# Patient Record
Sex: Male | Born: 1970 | Race: Black or African American | Hispanic: No | Marital: Single | Smoking: Current some day smoker
Health system: Southern US, Community
[De-identification: ages and names within clinical notes are randomized; demographics above are authoritative.]

## PROBLEM LIST (undated history)

## (undated) DIAGNOSIS — W3400XA Accidental discharge from unspecified firearms or gun, initial encounter: Secondary | ICD-10-CM

## (undated) DIAGNOSIS — F419 Anxiety disorder, unspecified: Secondary | ICD-10-CM

## (undated) DIAGNOSIS — I10 Essential (primary) hypertension: Secondary | ICD-10-CM

## (undated) HISTORY — PX: HERNIA REPAIR: SHX51

---

## 1993-02-26 DIAGNOSIS — Y249XXA Unspecified firearm discharge, undetermined intent, initial encounter: Secondary | ICD-10-CM

## 1993-02-26 DIAGNOSIS — W3400XA Accidental discharge from unspecified firearms or gun, initial encounter: Secondary | ICD-10-CM

## 1993-02-26 HISTORY — DX: Unspecified firearm discharge, undetermined intent, initial encounter: Y24.9XXA

## 1993-02-26 HISTORY — DX: Accidental discharge from unspecified firearms or gun, initial encounter: W34.00XA

## 1998-07-15 ENCOUNTER — Emergency Department (HOSPITAL_COMMUNITY): Admission: EM | Admit: 1998-07-15 | Discharge: 1998-07-15 | Payer: Self-pay | Admitting: Emergency Medicine

## 1999-04-12 ENCOUNTER — Emergency Department (HOSPITAL_COMMUNITY): Admission: EM | Admit: 1999-04-12 | Discharge: 1999-04-12 | Payer: Self-pay | Admitting: Emergency Medicine

## 2000-03-09 ENCOUNTER — Emergency Department (HOSPITAL_COMMUNITY): Admission: EM | Admit: 2000-03-09 | Discharge: 2000-03-09 | Payer: Self-pay | Admitting: Emergency Medicine

## 2000-05-12 ENCOUNTER — Emergency Department (HOSPITAL_COMMUNITY): Admission: EM | Admit: 2000-05-12 | Discharge: 2000-05-12 | Payer: Self-pay | Admitting: *Deleted

## 2014-09-27 ENCOUNTER — Emergency Department (HOSPITAL_COMMUNITY)
Admission: EM | Admit: 2014-09-27 | Discharge: 2014-09-27 | Disposition: A | Discharge status: Home / Self Care | Payer: Self-pay | Attending: Emergency Medicine | Admitting: Emergency Medicine

## 2014-09-27 ENCOUNTER — Encounter (HOSPITAL_COMMUNITY): Payer: Self-pay

## 2014-09-27 DIAGNOSIS — Z87828 Personal history of other (healed) physical injury and trauma: Secondary | ICD-10-CM | POA: Insufficient documentation

## 2014-09-27 DIAGNOSIS — K4090 Unilateral inguinal hernia, without obstruction or gangrene, not specified as recurrent: Secondary | ICD-10-CM | POA: Insufficient documentation

## 2014-09-27 DIAGNOSIS — M79605 Pain in left leg: Secondary | ICD-10-CM | POA: Insufficient documentation

## 2014-09-27 DIAGNOSIS — Z720 Tobacco use: Secondary | ICD-10-CM | POA: Insufficient documentation

## 2014-09-27 HISTORY — DX: Accidental discharge from unspecified firearms or gun, initial encounter: W34.00XA

## 2014-09-27 NOTE — Discharge Instructions (Signed)
Please follow-up with surgeon as discussed. Please monitor for new or worsening signs or symptoms, be experience any please return for further evaluation.
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
## 2016-05-10 ENCOUNTER — Encounter (HOSPITAL_COMMUNITY): Payer: Self-pay | Admitting: Emergency Medicine

## 2016-05-10 ENCOUNTER — Emergency Department (HOSPITAL_COMMUNITY)
Admission: EM | Admit: 2016-05-10 | Discharge: 2016-05-10 | Disposition: A | Discharge status: Home / Self Care | Payer: Self-pay | Attending: Emergency Medicine | Admitting: Emergency Medicine

## 2016-05-10 ENCOUNTER — Emergency Department (HOSPITAL_COMMUNITY): Payer: Self-pay

## 2016-05-10 DIAGNOSIS — F17210 Nicotine dependence, cigarettes, uncomplicated: Secondary | ICD-10-CM | POA: Insufficient documentation

## 2016-05-10 DIAGNOSIS — K4090 Unilateral inguinal hernia, without obstruction or gangrene, not specified as recurrent: Secondary | ICD-10-CM | POA: Insufficient documentation

## 2016-05-10 LAB — CBC
HEMATOCRIT: 40.2 % (ref 39.0–52.0)
HEMOGLOBIN: 13.4 g/dL (ref 13.0–17.0)
MCH: 27.4 pg (ref 26.0–34.0)
MCHC: 33.3 g/dL (ref 30.0–36.0)
MCV: 82.2 fL (ref 78.0–100.0)
Platelets: 287 10*3/uL (ref 150–400)
RBC: 4.89 MIL/uL (ref 4.22–5.81)
RDW: 14.5 % (ref 11.5–15.5)
WBC: 16.1 10*3/uL — ABNORMAL HIGH (ref 4.0–10.5)

## 2016-05-10 LAB — BASIC METABOLIC PANEL
Anion gap: 10 (ref 5–15)
BUN: 15 mg/dL (ref 6–20)
CHLORIDE: 102 mmol/L (ref 101–111)
CO2: 25 mmol/L (ref 22–32)
CREATININE: 1.25 mg/dL — AB (ref 0.61–1.24)
Calcium: 9.4 mg/dL (ref 8.9–10.3)
GFR calc Af Amer: >60 mL/min (ref >60)
GFR calc non Af Amer: >60 mL/min (ref >60)
Glucose, Bld: 129 mg/dL — ABNORMAL HIGH (ref 65–99)
POTASSIUM: 3.4 mmol/L — AB (ref 3.5–5.1)
Sodium: 137 mmol/L (ref 135–145)

## 2016-05-10 MED ORDER — IOPAMIDOL (ISOVUE-300) INJECTION 61%
INTRAVENOUS | Status: AC
  Administered 2016-05-10: 100 mL
  Filled 2016-05-10: qty 100

## 2016-05-10 MED ORDER — HYDROMORPHONE HCL 1 MG/ML IJ SOLN
1.0000 mg | Freq: Once | INTRAMUSCULAR | Status: AC
  Administered 2016-05-10: 1 mg via INTRAVENOUS
  Filled 2016-05-10: qty 1

## 2016-05-10 MED ORDER — OXYCODONE-ACETAMINOPHEN 5-325 MG PO TABS: 1.0000 | ORAL_TABLET | ORAL | 0 refills | Status: DC | PRN

## 2016-05-10 NOTE — ED Triage Notes (Signed)
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 States no PCP/GI MD involvement. Also reports abdominal hernia. States "vomiking phlegm."

## 2016-05-10 NOTE — ED Notes (Signed)
Pt ambulatory to restroom, steady gait.

## 2016-05-10 NOTE — ED Provider Notes (Signed)
Medical screening examination/treatment/procedure(s) were conducted as a shared visit with non-physician practitioner(s) and myself.  I personally evaluated the patient during the encounter. Briefly, the patient is a 46 y.o. male history of recurrent right inguinal hernia presents to the ED with recurrence of his inguinal hernia that is painful. CT scan without evidence of obstruction. Hernia easily reducible after pain medicine.   Hernia reduction Date/Time: 05/10/2016 6:09 AM Performed by: Nira ConnARDAMA, Darrelyn Morro EDUARDO Authorized by: Nira ConnARDAMA, Brissia Delisa EDUARDO  Consent: Verbal consent obtained. Consent given by: patient Patient understanding: patient states understanding of the procedure being performed Patient identity confirmed: verbally with patient Local anesthesia used: no  Anesthesia: Local anesthesia used: no  Sedation: Patient sedated: no Patient tolerance: Patient tolerated the procedure well with no immediate complications    Safe for discharge with strict return precautions.   Nira ConnPedro Eduardo Khiree Bukhari, MD 05/10/16 [phone]
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
## 2017-10-26 ENCOUNTER — Inpatient Hospital Stay (HOSPITAL_COMMUNITY)
Admission: EM | Admit: 2017-10-26 | Discharge: 2017-10-28 | DRG: 352 | Disposition: A | Discharge status: Home / Self Care | Payer: Self-pay | Attending: General Surgery | Admitting: General Surgery

## 2017-10-26 ENCOUNTER — Other Ambulatory Visit: Payer: Self-pay

## 2017-10-26 ENCOUNTER — Emergency Department (HOSPITAL_COMMUNITY): Payer: Self-pay

## 2017-10-26 ENCOUNTER — Encounter (HOSPITAL_COMMUNITY): Payer: Self-pay

## 2017-10-26 DIAGNOSIS — K4090 Unilateral inguinal hernia, without obstruction or gangrene, not specified as recurrent: Secondary | ICD-10-CM

## 2017-10-26 DIAGNOSIS — K4031 Unilateral inguinal hernia, with obstruction, without gangrene, recurrent: Secondary | ICD-10-CM

## 2017-10-26 DIAGNOSIS — K56609 Unspecified intestinal obstruction, unspecified as to partial versus complete obstruction: Secondary | ICD-10-CM

## 2017-10-26 DIAGNOSIS — K4030 Unilateral inguinal hernia, with obstruction, without gangrene, not specified as recurrent: Principal | ICD-10-CM | POA: Diagnosis present

## 2017-10-26 LAB — CBC WITH DIFFERENTIAL/PLATELET
Basophils Absolute: 0 10*3/uL (ref 0.0–0.1)
Basophils Relative: 0 %
Eosinophils Absolute: 0.1 10*3/uL (ref 0.0–0.7)
Eosinophils Relative: 0 %
HCT: 41.8 % (ref 39.0–52.0)
HEMOGLOBIN: 14 g/dL (ref 13.0–17.0)
LYMPHS ABS: 1.7 10*3/uL (ref 0.7–4.0)
LYMPHS PCT: 11 %
MCH: 27.7 pg (ref 26.0–34.0)
MCHC: 33.5 g/dL (ref 30.0–36.0)
MCV: 82.6 fL (ref 78.0–100.0)
Monocytes Absolute: 0.7 10*3/uL (ref 0.1–1.0)
Monocytes Relative: 5 %
NEUTROS ABS: 12.7 10*3/uL — AB (ref 1.7–7.7)
NEUTROS PCT: 84 %
Platelets: 335 10*3/uL (ref 150–400)
RBC: 5.06 MIL/uL (ref 4.22–5.81)
RDW: 14.1 % (ref 11.5–15.5)
WBC: 15.1 10*3/uL — ABNORMAL HIGH (ref 4.0–10.5)

## 2017-10-26 LAB — COMPREHENSIVE METABOLIC PANEL
ALBUMIN: 4.6 g/dL (ref 3.5–5.0)
ALT: 20 U/L (ref 0–44)
AST: 37 U/L (ref 15–41)
Alkaline Phosphatase: 61 U/L (ref 38–126)
Anion gap: 13 (ref 5–15)
BUN: 23 mg/dL — AB (ref 6–20)
CALCIUM: 9.9 mg/dL (ref 8.9–10.3)
CHLORIDE: 101 mmol/L (ref 98–111)
CO2: 28 mmol/L (ref 22–32)
CREATININE: 1.28 mg/dL — AB (ref 0.61–1.24)
GFR calc Af Amer: >60 mL/min (ref >60)
GFR calc non Af Amer: >60 mL/min (ref >60)
GLUCOSE: 100 mg/dL — AB (ref 70–99)
Potassium: 4.1 mmol/L (ref 3.5–5.1)
SODIUM: 142 mmol/L (ref 135–145)
Total Bilirubin: 1.1 mg/dL (ref 0.3–1.2)
Total Protein: 8.3 g/dL — ABNORMAL HIGH (ref 6.5–8.1)

## 2017-10-26 LAB — I-STAT CG4 LACTIC ACID, ED: Lactic Acid, Venous: 1.28 mmol/L (ref 0.5–1.9)

## 2017-10-26 LAB — ETHANOL: Alcohol, Ethyl (B): <10 mg/dL (ref <10)

## 2017-10-26 MED ORDER — PROPOFOL 10 MG/ML IV BOLUS
1.0000 mg/kg | Freq: Once | INTRAVENOUS | Status: AC
  Administered 2017-10-26: 80 mg via INTRAVENOUS
  Filled 2017-10-26: qty 20

## 2017-10-26 MED ORDER — SODIUM CHLORIDE 0.9 % IV BOLUS
1000.0000 mL | Freq: Once | INTRAVENOUS | Status: AC
  Administered 2017-10-26: 1000 mL via INTRAVENOUS

## 2017-10-26 MED ORDER — ONDANSETRON HCL 4 MG/2ML IJ SOLN
4.0000 mg | Freq: Once | INTRAMUSCULAR | Status: AC
  Administered 2017-10-26: 4 mg via INTRAVENOUS
  Filled 2017-10-26: qty 2

## 2017-10-26 MED ORDER — IOPAMIDOL (ISOVUE-300) INJECTION 61%
100.0000 mL | Freq: Once | INTRAVENOUS | Status: AC | PRN
  Administered 2017-10-26: 100 mL via INTRAVENOUS

## 2017-10-26 MED ORDER — IOPAMIDOL (ISOVUE-300) INJECTION 61%
INTRAVENOUS | Status: AC
  Filled 2017-10-26: qty 100

## 2017-10-26 MED ORDER — MORPHINE SULFATE (PF) 4 MG/ML IV SOLN
4.0000 mg | Freq: Once | INTRAVENOUS | Status: AC
  Administered 2017-10-26: 4 mg via INTRAVENOUS
  Filled 2017-10-26: qty 1

## 2017-10-26 NOTE — ED Triage Notes (Signed)
Pt comes from home. Pt is AOx4 and ambulatory. Pt called 911 due to possible hernia pain that patient has not been diagnosed with.Possible Inguinal Hernia on right side. Pt stated he has been living with it for a year however pt stated it began to hurt more today than usual. Pt has not been very forth coming with EMS regarding information about pain, location, basically anything regarding the chief complaint pt has provided pieces of info. Pt did have episode of N/V.  Pt has no other complaints.  Pt would not let EMS check area where hernia is.  Medications given in route by EMS- 4mg  Zofran  100mcg Fentanyl

## 2017-10-26 NOTE — ED Notes (Signed)
Bed: [id number] Expected date:  Expected time:  Means of arrival:  Comments: Hernia pain
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
## 2017-10-27 ENCOUNTER — Encounter (HOSPITAL_COMMUNITY): Payer: Self-pay | Admitting: *Deleted

## 2017-10-27 ENCOUNTER — Inpatient Hospital Stay (HOSPITAL_COMMUNITY): Payer: Self-pay

## 2017-10-27 ENCOUNTER — Encounter (HOSPITAL_COMMUNITY): Admission: EM | Disposition: A | Discharge status: Home / Self Care | Payer: Self-pay | Source: Home / Self Care

## 2017-10-27 ENCOUNTER — Inpatient Hospital Stay (HOSPITAL_COMMUNITY): Payer: Self-pay | Admitting: Certified Registered Nurse Anesthetist

## 2017-10-27 DIAGNOSIS — K4090 Unilateral inguinal hernia, without obstruction or gangrene, not specified as recurrent: Secondary | ICD-10-CM

## 2017-10-27 HISTORY — PX: INGUINAL HERNIA REPAIR: SHX194

## 2017-10-27 LAB — SURGICAL PCR SCREEN
MRSA, PCR: NEGATIVE
Staphylococcus aureus: NEGATIVE

## 2017-10-27 LAB — BASIC METABOLIC PANEL
Anion gap: 10 (ref 5–15)
BUN: 18 mg/dL (ref 6–20)
CO2: 28 mmol/L (ref 22–32)
Calcium: 9.1 mg/dL (ref 8.9–10.3)
Chloride: 103 mmol/L (ref 98–111)
Creatinine, Ser: 1.19 mg/dL (ref 0.61–1.24)
GFR calc Af Amer: >60 mL/min (ref >60)
Glucose, Bld: 88 mg/dL (ref 70–99)
POTASSIUM: 3.7 mmol/L (ref 3.5–5.1)
SODIUM: 141 mmol/L (ref 135–145)

## 2017-10-27 LAB — CBC
HEMATOCRIT: 41.1 % (ref 39.0–52.0)
Hemoglobin: 13.5 g/dL (ref 13.0–17.0)
MCH: 27.2 pg (ref 26.0–34.0)
MCHC: 32.8 g/dL (ref 30.0–36.0)
MCV: 82.9 fL (ref 78.0–100.0)
Platelets: 335 10*3/uL (ref 150–400)
RBC: 4.96 MIL/uL (ref 4.22–5.81)
RDW: 14.2 % (ref 11.5–15.5)
WBC: 11.6 10*3/uL — ABNORMAL HIGH (ref 4.0–10.5)

## 2017-10-27 LAB — HIV ANTIBODY (ROUTINE TESTING W REFLEX): HIV SCREEN 4TH GENERATION: NONREACTIVE

## 2017-10-27 SURGERY — REPAIR, HERNIA, INGUINAL, ADULT
Anesthesia: General | Site: Groin | Laterality: Right

## 2017-10-27 MED ORDER — HYDRALAZINE HCL 20 MG/ML IJ SOLN: 10.0000 mg | INTRAMUSCULAR | Status: DC | PRN

## 2017-10-27 MED ORDER — ONDANSETRON 4 MG PO TBDP: 4.0000 mg | ORAL_TABLET | Freq: Four times a day (QID) | ORAL | Status: DC | PRN

## 2017-10-27 MED ORDER — MORPHINE SULFATE (PF) 2 MG/ML IV SOLN: 1.0000 mg | INTRAVENOUS | Status: DC | PRN

## 2017-10-27 MED ORDER — LIDOCAINE 2% (20 MG/ML) 5 ML SYRINGE
INTRAMUSCULAR | Status: DC | PRN
  Administered 2017-10-27: 100 mg via INTRAVENOUS

## 2017-10-27 MED ORDER — TRAMADOL HCL 50 MG PO TABS: 50.0000 mg | ORAL_TABLET | Freq: Four times a day (QID) | ORAL | Status: DC | PRN

## 2017-10-27 MED ORDER — HYDROCODONE-ACETAMINOPHEN 5-325 MG PO TABS
1.0000 | ORAL_TABLET | ORAL | Status: DC | PRN
  Administered 2017-10-27 (×2): 1 via ORAL
  Filled 2017-10-27 (×2): qty 1

## 2017-10-27 MED ORDER — DEXAMETHASONE SODIUM PHOSPHATE 4 MG/ML IJ SOLN
INTRAMUSCULAR | Status: DC | PRN
  Administered 2017-10-27: 10 mg via INTRAVENOUS

## 2017-10-27 MED ORDER — 0.9 % SODIUM CHLORIDE (POUR BTL) OPTIME
TOPICAL | Status: DC | PRN
  Administered 2017-10-27: 1000 mL

## 2017-10-27 MED ORDER — PROPOFOL 10 MG/ML IV BOLUS
INTRAVENOUS | Status: DC | PRN
  Administered 2017-10-27: 200 mg via INTRAVENOUS

## 2017-10-27 MED ORDER — LACTATED RINGERS IV SOLN
INTRAVENOUS | Status: DC | PRN
  Administered 2017-10-27 (×2): via INTRAVENOUS

## 2017-10-27 MED ORDER — ONDANSETRON HCL 4 MG/2ML IJ SOLN
4.0000 mg | Freq: Four times a day (QID) | INTRAMUSCULAR | Status: DC | PRN
  Administered 2017-10-27: 4 mg via INTRAVENOUS

## 2017-10-27 MED ORDER — CEFAZOLIN SODIUM-DEXTROSE 2-4 GM/100ML-% IV SOLN
2.0000 g | INTRAVENOUS | Status: AC
  Administered 2017-10-27: 2 g via INTRAVENOUS

## 2017-10-27 MED ORDER — IBUPROFEN 200 MG PO TABS: 600.0000 mg | ORAL_TABLET | Freq: Four times a day (QID) | ORAL | Status: DC | PRN

## 2017-10-27 MED ORDER — KCL IN DEXTROSE-NACL 20-5-0.45 MEQ/L-%-% IV SOLN
INTRAVENOUS | Status: DC
  Administered 2017-10-27: 02:00:00 via INTRAVENOUS
  Filled 2017-10-27 (×2): qty 1000

## 2017-10-27 MED ORDER — HYDROMORPHONE HCL 1 MG/ML IJ SOLN: 0.2500 mg | INTRAMUSCULAR | Status: DC | PRN

## 2017-10-27 MED ORDER — PROPOFOL 10 MG/ML IV BOLUS
INTRAVENOUS | Status: AC
  Filled 2017-10-27: qty 20

## 2017-10-27 MED ORDER — PHENYLEPHRINE 40 MCG/ML (10ML) SYRINGE FOR IV PUSH (FOR BLOOD PRESSURE SUPPORT)
PREFILLED_SYRINGE | INTRAVENOUS | Status: DC | PRN
  Administered 2017-10-27 (×5): 80 ug via INTRAVENOUS

## 2017-10-27 MED ORDER — FENTANYL CITRATE (PF) 100 MCG/2ML IJ SOLN
INTRAMUSCULAR | Status: DC | PRN
  Administered 2017-10-27: 100 ug via INTRAVENOUS
  Administered 2017-10-27 (×3): 50 ug via INTRAVENOUS

## 2017-10-27 MED ORDER — DIPHENHYDRAMINE HCL 50 MG/ML IJ SOLN: 25.0000 mg | Freq: Four times a day (QID) | INTRAMUSCULAR | Status: DC | PRN

## 2017-10-27 MED ORDER — FENTANYL CITRATE (PF) 250 MCG/5ML IJ SOLN
INTRAMUSCULAR | Status: AC
  Filled 2017-10-27: qty 5

## 2017-10-27 MED ORDER — DIPHENHYDRAMINE HCL 25 MG PO CAPS: 25.0000 mg | ORAL_CAPSULE | Freq: Four times a day (QID) | ORAL | Status: DC | PRN

## 2017-10-27 MED ORDER — ROCURONIUM BROMIDE 10 MG/ML (PF) SYRINGE
PREFILLED_SYRINGE | INTRAVENOUS | Status: DC | PRN
  Administered 2017-10-27: 20 mg via INTRAVENOUS
  Administered 2017-10-27: 70 mg via INTRAVENOUS

## 2017-10-27 MED ORDER — ENOXAPARIN SODIUM 40 MG/0.4ML ~~LOC~~ SOLN
40.0000 mg | Freq: Every day | SUBCUTANEOUS | Status: DC
  Administered 2017-10-27: 40 mg via SUBCUTANEOUS
  Filled 2017-10-27: qty 0.4

## 2017-10-27 MED ORDER — ONDANSETRON HCL 4 MG/2ML IJ SOLN: 4.0000 mg | Freq: Four times a day (QID) | INTRAMUSCULAR | Status: DC | PRN

## 2017-10-27 MED ORDER — PROMETHAZINE HCL 25 MG/ML IJ SOLN: 6.2500 mg | INTRAMUSCULAR | Status: DC | PRN

## 2017-10-27 MED ORDER — MIDAZOLAM HCL 2 MG/2ML IJ SOLN
INTRAMUSCULAR | Status: AC
  Filled 2017-10-27: qty 2

## 2017-10-27 MED ORDER — CEFAZOLIN SODIUM-DEXTROSE 2-4 GM/100ML-% IV SOLN
2.0000 g | Freq: Three times a day (TID) | INTRAVENOUS | Status: AC
  Administered 2017-10-27: 2 g via INTRAVENOUS
  Filled 2017-10-27: qty 100

## 2017-10-27 MED ORDER — MEPERIDINE HCL 50 MG/ML IJ SOLN: 6.2500 mg | INTRAMUSCULAR | Status: DC | PRN

## 2017-10-27 MED ORDER — KCL IN DEXTROSE-NACL 20-5-0.45 MEQ/L-%-% IV SOLN
INTRAVENOUS | Status: DC
  Administered 2017-10-27: 14:00:00 via INTRAVENOUS
  Filled 2017-10-27 (×2): qty 1000

## 2017-10-27 MED ORDER — MIDAZOLAM HCL 5 MG/5ML IJ SOLN
INTRAMUSCULAR | Status: DC | PRN
  Administered 2017-10-27: 2 mg via INTRAVENOUS

## 2017-10-27 MED ORDER — SUGAMMADEX SODIUM 200 MG/2ML IV SOLN
INTRAVENOUS | Status: DC | PRN
  Administered 2017-10-27: 200 mg via INTRAVENOUS

## 2017-10-27 MED ORDER — CEFAZOLIN SODIUM-DEXTROSE 2-4 GM/100ML-% IV SOLN
INTRAVENOUS | Status: AC
  Filled 2017-10-27: qty 100

## 2017-10-27 MED ORDER — HYDROMORPHONE HCL 1 MG/ML IJ SOLN: 0.5000 mg | INTRAMUSCULAR | Status: DC | PRN

## 2017-10-27 MED ORDER — BUPIVACAINE LIPOSOME 1.3 % IJ SUSP
20.0000 mL | Freq: Once | INTRAMUSCULAR | Status: AC
  Administered 2017-10-27: 20 mL
  Filled 2017-10-27: qty 20

## 2017-10-27 SURGICAL SUPPLY — 26 items
ADH SKN CLS APL DERMABOND .7 (GAUZE/BANDAGES/DRESSINGS) ×1
CHLORAPREP W/TINT 26ML (MISCELLANEOUS) ×3 IMPLANT
DECANTER SPIKE VIAL GLASS SM (MISCELLANEOUS) ×2 IMPLANT
DERMABOND ADVANCED (GAUZE/BANDAGES/DRESSINGS) ×2
DERMABOND ADVANCED .7 DNX12 (GAUZE/BANDAGES/DRESSINGS) ×1 IMPLANT
DRAIN PENROSE 18X1/2 LTX STRL (DRAIN) ×3 IMPLANT
DRAPE LAPAROTOMY TRNSV 102X78 (DRAPE) ×3 IMPLANT
DRAPE UTILITY XL STRL (DRAPES) ×3 IMPLANT
ELECT REM PT RETURN 15FT ADLT (MISCELLANEOUS) ×3 IMPLANT
GLOVE BIO SURGEON STRL SZ 6.5 (GLOVE) ×2 IMPLANT
GLOVE BIO SURGEONS STRL SZ 6.5 (GLOVE) ×1
GLOVE BIOGEL PI IND STRL 7.0 (GLOVE) ×1 IMPLANT
GLOVE BIOGEL PI INDICATOR 7.0 (GLOVE) ×2
GOWN STRL REUS W/TWL 2XL LVL3 (GOWN DISPOSABLE) ×3 IMPLANT
GOWN STRL REUS W/TWL XL LVL3 (GOWN DISPOSABLE) ×3 IMPLANT
KIT BASIN OR (CUSTOM PROCEDURE TRAY) ×3 IMPLANT
MESH HERNIA 3X6 (Mesh General) ×2 IMPLANT
NEEDLE HYPO 22GX1.5 SAFETY (NEEDLE) ×3 IMPLANT
PACK GENERAL/GYN (CUSTOM PROCEDURE TRAY) ×3 IMPLANT
SUT PROLENE 2 0 CT2 30 (SUTURE) ×2 IMPLANT
SUT VIC AB 2-0 SH 18 (SUTURE) ×3 IMPLANT
SUT VIC AB 3-0 SH 18 (SUTURE) ×3 IMPLANT
SUT VIC AB 4-0 PS2 27 (SUTURE) ×3 IMPLANT
SYR CONTROL 10ML LL (SYRINGE) ×3 IMPLANT
TOWEL OR 17X26 10 PK STRL BLUE (TOWEL DISPOSABLE) ×3 IMPLANT
TOWEL OR NON WOVEN STRL DISP B (DISPOSABLE) ×3 IMPLANT
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
## 2017-10-27 NOTE — Transfer of Care (Signed)
Immediate Anesthesia Transfer of Care Note  Patient: Alan Mullen  Procedure(s) Performed: HERNIA REPAIR INGUINAL ADULT WITH MESH (Right Groin)  Patient Location: PACU  Anesthesia Type:General  Level of Consciousness: drowsy  Airway & Oxygen Therapy: Patient Spontanous Breathing and Patient connected to face mask  Post-op Assessment: Report given to RN and Post -op Vital signs reviewed and stable  Post vital signs: Reviewed and stable  Last Vitals:  Vitals Value Taken Time  BP    Temp    Pulse 45 10/27/2017 12:29 PM  Resp    SpO2 99 % 10/27/2017 12:29 PM  Vitals shown include unvalidated device data.  Last Pain:  Vitals:   10/27/17 0457  TempSrc: Oral  PainSc:       Patients Stated Pain Goal: 2 (10/27/17 0037)  Complications: No apparent anesthesia complications

## 2017-10-27 NOTE — Anesthesia Preprocedure Evaluation (Addendum)
Anesthesia Evaluation  Patient identified by MRN, date of birth, ID band Patient awake    Reviewed: Allergy & Precautions, NPO status , Patient's Chart, lab work & pertinent test results  Airway Mallampati: II  TM Distance: >3 FB Neck ROM: Full    Dental no notable dental hx.    Pulmonary neg pulmonary ROS, Current Smoker,    Pulmonary exam normal breath sounds clear to auscultation       Cardiovascular negative cardio ROS Normal cardiovascular exam Rhythm:Regular Rate:Normal     Neuro/Psych negative neurological ROS  negative psych ROS   GI/Hepatic negative GI ROS, (+)     substance abuse  marijuana use,   Endo/Other  negative endocrine ROS  Renal/GU negative Renal ROS     Musculoskeletal negative musculoskeletal ROS (+)   Abdominal   Peds  Hematology negative hematology ROS (+)   Anesthesia Other Findings Day of surgery medications reviewed with the patient.  Reproductive/Obstetrics                            Anesthesia Physical Anesthesia Plan  ASA: II  Anesthesia Plan: General   Post-op Pain Management:    Induction: Intravenous  PONV Risk Score and Plan: 3 and Dexamethasone and Ondansetron  Airway Management Planned: Oral ETT  Additional Equipment: None  Intra-op Plan:   Post-operative Plan: Extubation in OR  Informed Consent: I have reviewed the patients History and Physical, chart, labs and discussed the procedure including the risks, benefits and alternatives for the proposed anesthesia with the patient or authorized representative who has indicated his/her understanding and acceptance.   Dental advisory given  Plan Discussed with: CRNA  Anesthesia Plan Comments:         Anesthesia Quick Evaluation

## 2017-10-27 NOTE — Progress Notes (Signed)
   10/26/17 2314  MEWS Score  MEWS Score Color Yellow  Patient is showing no signs of decreased consciousness or a decline in condition.

## 2017-10-27 NOTE — Anesthesia Procedure Notes (Signed)
Procedure Name: Intubation Date/Time: 10/27/2017 10:42 AM Performed by: Vanessa [place], CRNA Pre-anesthesia Checklist: Patient identified, Emergency Drugs available, Suction available and Patient being monitored Patient Re-evaluated:Patient Re-evaluated prior to induction Oxygen Delivery Method: Circle system utilized Preoxygenation: Pre-oxygenation with 100% oxygen Induction Type: IV induction Ventilation: Mask ventilation without difficulty Laryngoscope Size: 2 and Crosland Grade View: Grade I Tube type: Oral Tube size: 7.5 mm Number of attempts: 1 Airway Equipment and Method: Stylet Placement Confirmation: ETT inserted through vocal cords under direct vision,  positive ETCO2 and breath sounds checked- equal and bilateral Secured at: 23 cm Tube secured with: Tape Dental Injury: Teeth and Oropharynx as per pre-operative assessment

## 2017-10-27 NOTE — Progress Notes (Signed)
   10/26/17 2243  MEWS Score  Resp 14  Pulse Rate (!) 59  BP (!) 152/91  MEWS RR 0  MEWS Pulse 0  MEWS Systolic 0  MEWS LOC 3  MEWS Temp 0  MEWS Score 3  MEWS Score Color Yellow

## 2017-10-27 NOTE — Progress Notes (Signed)
Patient ID: Alan Mullen, male   DOB: 08/05/1970, 47 y.o.   MRN: [id number] [facility] Surgery Progress Note:   Day of Surgery  Subjective: Mental status is clear.  Patient not in pain and hernia reduced Objective: Vital signs in last 24 hours: Temp:  [97.8 F (36.6 C)-98.4 F (36.9 C)] 98.3 F (36.8 C) (09/01 0457) Pulse Rate:  [51-65] 54 (09/01 0457) Resp:  [12-20] 15 (09/01 0457) BP: (132-177)/(86-121) 141/91 (09/01 0457) SpO2:  [90 %-99 %] 93 % (09/01 0457) Weight:  [97.5 kg] 97.5 kg (08/31 1628)  Intake/Output from previous day: 08/31 0701 - 09/01 0700 In: 1300.5 [I.V.:300.5; IV Piggyback:1000] Out: -  Intake/Output this shift: No intake/output data recorded.  Physical Exam: Work of breathing is normal.  Patient is hungry and wants to eat. Perceives that he is having surgery at 9 am  Lab Results:  Results for orders placed or performed during the hospital encounter of 10/26/17 (from the past 48 hour(s))  Comprehensive metabolic panel     Status: Abnormal   Collection Time: 10/26/17  5:10 PM  Result Value Ref Range   Sodium 142 135 - 145 mmol/L   Potassium 4.1 3.5 - 5.1 mmol/L   Chloride 101 98 - 111 mmol/L   CO2 28 22 - 32 mmol/L   Glucose, Bld 100 (H) 70 - 99 mg/dL   BUN 23 (H) 6 - 20 mg/dL   Creatinine, Ser 1.28 (H) 0.61 - 1.24 mg/dL   Calcium 9.9 8.9 - 10.3 mg/dL   Total Protein 8.3 (H) 6.5 - 8.1 g/dL   Albumin 4.6 3.5 - 5.0 g/dL   AST 37 15 - 41 U/L   ALT 20 0 - 44 U/L   Alkaline Phosphatase 61 38 - 126 U/L   Total Bilirubin 1.1 0.3 - 1.2 mg/dL   GFR calc non Af Amer >60 >60 mL/min   GFR calc Af Amer >60 >60 mL/min    Comment: (NOTE) The eGFR has been calculated using the CKD EPI equation. This calculation has not been validated in all clinical situations. eGFR's persistently <60 mL/min signify possible Chronic Kidney Disease.    Anion gap 13 5 - 15    Comment: Performed at [facility], [place] [address], [place], [place] [address]   CBC with Differential     Status: Abnormal   Collection Time: 10/26/17  5:10 PM  Result Value Ref Range   WBC 15.1 (H) 4.0 - 10.5 K/uL   RBC 5.06 4.22 - 5.81 MIL/uL   Hemoglobin 14.0 13.0 - 17.0 g/dL   HCT 41.8 39.0 - 52.0 %   MCV 82.6 78.0 - 100.0 fL   MCH 27.7 26.0 - 34.0 pg   MCHC 33.5 30.0 - 36.0 g/dL   RDW 14.1 11.5 - 15.5 %   Platelets 335 150 - 400 K/uL   Neutrophils Relative % 84 %   Neutro Abs 12.7 (H) 1.7 - 7.7 K/uL   Lymphocytes Relative 11 %   Lymphs Abs 1.7 0.7 - 4.0 K/uL   Monocytes Relative 5 %   Monocytes Absolute 0.7 0.1 - 1.0 K/uL   Eosinophils Relative 0 %   Eosinophils Absolute 0.1 0.0 - 0.7 K/uL   Basophils Relative 0 %   Basophils Absolute 0.0 0.0 - 0.1 K/uL    Comment: Performed at [facility], [place] [address], [place], [place] [address]  Ethanol     Status: None   Collection Time: 10/26/17  5:10 PM  Result Value  Ref Range   Alcohol, Ethyl (B) <10 <10 mg/dL    Comment: (NOTE) Lowest detectable limit for serum alcohol is 10 mg/dL. For medical purposes only. Performed at [facility], [place] [address] [place], [place] [address]   I-Stat CG4 Lactic Acid, ED     Status: None   Collection Time: 10/26/17  5:41 PM  Result Value Ref Range   Lactic Acid, Venous 1.28 0.5 - 1.9 mmol/L  Basic metabolic panel     Status: None   Collection Time: 10/27/17  5:31 AM  Result Value Ref Range   Sodium 141 135 - 145 mmol/L   Potassium 3.7 3.5 - 5.1 mmol/L   Chloride 103 98 - 111 mmol/L   CO2 28 22 - 32 mmol/L   Glucose, Bld 88 70 - 99 mg/dL   BUN 18 6 - 20 mg/dL   Creatinine, Ser 1.19 0.61 - 1.24 mg/dL   Calcium 9.1 8.9 - 10.3 mg/dL   GFR calc non Af Amer >60 >60 mL/min   GFR calc Af Amer >60 >60 mL/min    Comment: (NOTE) The eGFR has been calculated using the CKD EPI equation. This calculation has not been validated in all clinical situations. eGFR's persistently <60 mL/min signify possible Chronic Kidney Disease.     Anion gap 10 5 - 15    Comment: Performed at [facility], [place] [address] [place], [place] [address]  CBC     Status: Abnormal   Collection Time: 10/27/17  5:31 AM  Result Value Ref Range   WBC 11.6 (H) 4.0 - 10.5 K/uL   RBC 4.96 4.22 - 5.81 MIL/uL   Hemoglobin 13.5 13.0 - 17.0 g/dL   HCT 41.1 39.0 - 52.0 %   MCV 82.9 78.0 - 100.0 fL   MCH 27.2 26.0 - 34.0 pg   MCHC 32.8 30.0 - 36.0 g/dL   RDW 14.2 11.5 - 15.5 %   Platelets 335 150 - 400 K/uL    Comment: Performed at [facility], [place] [address], [place], [place] [address]    Radiology/Results: Ct Abdomen Pelvis W Contrast  Result Date: 10/26/2017 CLINICAL DATA:  47 year old male with possible hernia pain on the right side. Pain increasing more than usual today. EXAM: CT ABDOMEN AND PELVIS WITH CONTRAST TECHNIQUE: Multidetector CT imaging of the abdomen and pelvis was performed using the standard protocol following bolus administration of intravenous contrast. CONTRAST:  143m ISOVUE-300 IOPAMIDOL (ISOVUE-300) INJECTION 61% COMPARISON:  CT the abdomen and pelvis 05/10/2016. FINDINGS: Lower chest: Unremarkable. Hepatobiliary: Subcentimeter low-attenuation lesion in the right lobe of the liver, too small to characterize, but statistically likely a tiny cyst. No other suspicious hepatic lesions. No intra or extrahepatic biliary ductal dilatation. Gallbladder is normal in appearance. Pancreas: No pancreatic mass. No pancreatic ductal dilatation. No pancreatic or peripancreatic fluid or inflammatory changes. Spleen: Unremarkable. Adrenals/Urinary Tract: Bilateral kidneys and bilateral adrenal glands are normal in appearance. No hydroureteronephrosis. Urinary bladder is normal in appearance. Stomach/Bowel: Normal appearance of the stomach. Multiple dilated loops of small bowel with air-fluid levels. There appears to be decompressed loops of small bowel extending into the patient's right inguinal canal via and  inguinal hernia. Small left inguinal hernia also noted, also with short segment of small bowel in the neck of the hernia. Small amount of gas and stool in the colon, however the colon is relatively decompressed. Appendix is not confidently identified. Vascular/Lymphatic: Mild atherosclerosis in the pelvic vasculature, without evidence of aneurysm or  dissection in the abdomen or pelvis. No lymphadenopathy noted in the abdomen or pelvis. Reproductive: Prostate gland and seminal vesicles are unremarkable in appearance. Other: No significant volume of ascites.  No pneumoperitoneum. Musculoskeletal: There are no aggressive appearing lytic or blastic lesions noted in the visualized portions of the skeleton. IMPRESSION: 1. Findings are concerning for early or partial small bowel obstruction, likely related to the patient's right inguinal hernia, as above. Surgical consultation is recommended. 2. Atherosclerosis. Electronically Signed   By: Vinnie Langton M.D.   On: 10/26/2017 19:14   Dg Abd Portable 1v  Result Date: 10/27/2017 CLINICAL DATA:  Patient with small bowel obstruction EXAM: PORTABLE ABDOMEN - 1 VIEW COMPARISON:  CT abdomen 10/26/2017 FINDINGS: Gas is demonstrated within mildly dilated loops of small bowel. Gas is demonstrated within the colon. Oral contrast and contrast material within the urinary bladder. Unremarkable osseous skeleton. IMPRESSION: Mildly gaseous distended loops of small bowel within the abdomen. Gas within the colon. Findings may represent ileus or early and/or partial small bowel obstruction. Electronically Signed   By: Lovey Newcomer M.D.   On: 10/27/2017 01:14    Anti-infectives: Anti-infectives (From admission, onward)   Start     Dose/Rate Route Frequency Ordered Stop   10/27/17 0600  ceFAZolin (ANCEF) IVPB 2g/100 mL premix     2 g 200 mL/hr over 30 Minutes Intravenous On call to O.R. 10/27/17 0033 10/28/17 0559      Assessment/Plan: Problem List: Patient Active Problem  List   Diagnosis Date Noted  . Inguinal hernia of right side with obstruction 10/26/2017    Reduced right inguinal hernia- for repair Day of Surgery    LOS: 1 day   Matt B. Hassell Done, MD, [facility] [phone] beeper [phone]  10/27/2017 7:53 AM

## 2017-10-27 NOTE — Progress Notes (Signed)
   10/27/17 0457  MEWS Score  MEWS Score Color Yellow  Patient is showing no sign of decrease in consciousness or decline in condition.

## 2017-10-28 LAB — CBC
HEMATOCRIT: 38.1 % — AB (ref 39.0–52.0)
HEMOGLOBIN: 12.4 g/dL — AB (ref 13.0–17.0)
MCH: 27.1 pg (ref 26.0–34.0)
MCHC: 32.5 g/dL (ref 30.0–36.0)
MCV: 83.4 fL (ref 78.0–100.0)
Platelets: 305 10*3/uL (ref 150–400)
RBC: 4.57 MIL/uL (ref 4.22–5.81)
RDW: 14.1 % (ref 11.5–15.5)
WBC: 17 10*3/uL — AB (ref 4.0–10.5)

## 2017-10-28 LAB — BASIC METABOLIC PANEL
ANION GAP: 8 (ref 5–15)
BUN: 16 mg/dL (ref 6–20)
CO2: 28 mmol/L (ref 22–32)
Calcium: 8.7 mg/dL — ABNORMAL LOW (ref 8.9–10.3)
Chloride: 102 mmol/L (ref 98–111)
Creatinine, Ser: 0.98 mg/dL (ref 0.61–1.24)
GFR calc Af Amer: >60 mL/min (ref >60)
GFR calc non Af Amer: >60 mL/min (ref >60)
GLUCOSE: 106 mg/dL — AB (ref 70–99)
POTASSIUM: 4.1 mmol/L (ref 3.5–5.1)
Sodium: 138 mmol/L (ref 135–145)

## 2017-10-28 MED ORDER — HYDROCODONE-ACETAMINOPHEN 5-325 MG PO TABS: 1.0000 | ORAL_TABLET | Freq: Four times a day (QID) | ORAL | 0 refills | Status: DC | PRN

## 2017-10-28 NOTE — Anesthesia Postprocedure Evaluation (Signed)
Anesthesia Post Note  Patient: Alan Mullen  Procedure(s) Performed: HERNIA REPAIR INGUINAL ADULT WITH MESH (Right Groin)     Patient location during evaluation: PACU Anesthesia Type: General Level of consciousness: sedated and patient cooperative Pain management: pain level controlled Vital Signs Assessment: post-procedure vital signs reviewed and stable Respiratory status: spontaneous breathing Cardiovascular status: stable Anesthetic complications: no    Last Vitals:  Vitals:   10/27/17 2358 10/28/17 0408  BP: 126/67 131/87  Pulse: 86 (!) 52  Resp: 18 15  Temp: 36.9 C 36.9 C  SpO2: 100% 100%    Last Pain:  Vitals:   10/28/17 0408  TempSrc: Oral  PainSc:                  [name]

## 2017-10-28 NOTE — Discharge Summary (Addendum)
Physician Discharge Summary  Patient ID: Alan Mullen MRN: [id number] DOB/AGE: [birth year] 47 y.o.  Admit date: 10/26/2017 Discharge date: 10/28/2017  Admission Diagnoses: incarcerated R inguinal hernia  Discharge Diagnoses:  Principal Problem:   Right inguinal hernia Active Problems:   Inguinal hernia of right side with obstruction   Discharged Condition: good  Hospital Course: Pt admitted after reduction of hernia under sedation.  His R inguinal hernia was repaired the following AM.  By POD 1 he was tolerating a diet and PO pain meds.  He was in stable condition for discharge.  Consults: None  Significant Diagnostic Studies: labs: cbc  Treatments: IV hydration, analgesia: Vicodin and surgery: R ing hernia repair: Dr Daphine Deutscher  Discharge Exam: Blood pressure 131/87, pulse (!) 52, temperature 98.4 F (36.9 C), temperature source Oral, resp. rate 15, height 6\' 1"  (1.854 m), weight 97.5 kg, SpO2 100 %. General appearance: alert and cooperative GI: normal findings: soft, non-tender Incision/Wound: clean, dry, intact  Disposition: Discharge disposition: 01-Home or Self Care        Allergies as of 10/28/2017   No Known Allergies     Medication List    STOP taking these medications   oxyCODONE-acetaminophen 5-325 MG tablet Commonly known as:  PERCOCET/ROXICET     TAKE these medications   HYDROcodone-acetaminophen 5-325 MG tablet Commonly known as:  NORCO/VICODIN Take 1-2 tablets by mouth every 6 (six) hours as needed for moderate pain.      Follow-up Information    [facility], [place]. Schedule an appointment as soon as possible for a visit in 2 week(s).   Specialty:  General Surgery Contact information: [address] [place] [address] [phone]         I have reviewed the [facility] database for this patient.    Signed: [name] C. 10/28/2017, 9:27 AM

## 2017-10-28 NOTE — Care Management Note (Signed)
Case Management Note  Patient Details  Name: Alan Mullen MRN: [id number] Date of Birth: 1970-12-05  Subjective/Objective:                  Discharge  : incarcerated R inguinal hernia  Discharge Diagnoses:  Principal Problem:   Right inguinal hernia Active Problems:   Inguinal hernia of right side with obstruction   Discharged Condition: good  Hospital Course: Pt admitted after reduction of hernia under sedation.  His R inguinal hernia was repaired the following AM.  By POD 1 he was tolerating a diet and PO pain meds.  He was in stable condition for discharge.  Consults: None  Significant Diagnostic Studies: labs: cbc  Treatments: IV hydration, analgesia: Vicodin and surgery: R ing hernia repair: Dr Daphine Deutscher  Discharge Exam: Blood pressure 131/87, pulse (!) 52, temperature 98.4 F (36.9 C), temperature source Oral, resp. rate 15, height 6\' 1"  (1.854 m), weight 97.5 kg, SpO2 100 %. General appearance: alert and cooperative GI: normal findings: soft, non-tender Incision/Wound: clean, dry, intact  Disposition: Discharge disposition: 01-Home or Self Care Action/Plan: No cm needs present at time of dc/patient has good home support group.  Understands to follow up with provider prefers to make her own appointments.  Expected Discharge Date:  10/28/17               Expected Discharge Plan:     In-House Referral:     Discharge planning Services     Post Acute Care Choice:    Choice offered to:     DME Arranged:    DME Agency:     HH Arranged:    HH Agency:     Status of Service:     If discussed at [employer], dates discussed:    Additional Comments:  Golda Acre, RN 10/28/2017, 10:14 AM

## 2017-10-28 NOTE — Discharge Instructions (Signed)
HERNIA REPAIR: POST OP INSTRUCTIONS ° °###################################################################### ° °EAT °Gradually transition to a high fiber diet with a fiber supplement over the next few weeks after discharge.  Start with a pureed / full liquid diet (see below) ° °WALK °Walk an hour a day.  Control your pain to do that.   ° °CONTROL PAIN °Control pain so that you can walk, sleep, tolerate sneezing/coughing, and go up/down stairs. ° °HAVE A BOWEL MOVEMENT DAILY °Keep your bowels regular to avoid problems.  OK to try a laxative to override constipation.  OK to use an antidairrheal to slow down diarrhea.  Call if not better after 2 tries ° °CALL IF YOU HAVE PROBLEMS/CONCERNS °Call if you are still struggling despite following these instructions. °Call if you have concerns not answered by these instructions ° °###################################################################### ° ° ° °1. DIET: Follow a light bland diet the first 24 hours after arrival home, such as soup, liquids, crackers, etc.  Be sure to include lots of fluids daily.  Advance to a low fat / high fiber diet over the next few days after surgery.  Avoid fast food or heavy meals the first week as your are more likely to get nauseated.   ° °2. Take your usually prescribed home medications unless otherwise directed. ° °3. PAIN CONTROL: °a. Pain is best controlled by a usual combination of three different methods TOGETHER: °i. Ice/Heat °ii. Over the counter pain medication °iii. Prescription pain medication °b. Most patients will experience some swelling and bruising around the hernia(s) such as the bellybutton, groins, or old incisions.  Ice packs or heating pads (30-60 minutes up to 6 times a day) will help. Use ice for the first few days to help decrease swelling and bruising, then switch to heat to help relax tight/sore spots and speed recovery.  Some people prefer to use ice alone, heat alone, alternating between ice & heat.  Experiment  to what works for you.  Swelling and bruising can take several weeks to resolve.   °c. It is helpful to take an over-the-counter pain medication regularly for the first few weeks.  Choose one of the following that works best for you: °i. Naproxen (Aleve, etc)  Two 220mg tabs twice a day °ii. Ibuprofen (Advil, etc) Three 200mg tabs four times a day (every meal & bedtime) °iii. Acetaminophen (Tylenol, etc) 325-650mg four times a day (every meal & bedtime) °d. A  prescription for pain medication should be given to you upon discharge.  Take your pain medication as prescribed.  °i. If you are having problems/concerns with the prescription medicine (does not control pain, nausea, vomiting, rash, itching, etc), please call us [phone] to see if we need to switch you to a different pain medicine that will work better for you and/or control your side effect better. °ii. If you need a refill on your pain medication, please contact your pharmacy.  They will contact our office to request authorization. Prescriptions will not be filled after 5 pm or on week-ends. ° °4. Avoid getting constipated.  Between the surgery and the pain medications, it is common to experience some constipation.  Increasing fluid intake and taking a fiber supplement (such as Metamucil, Citrucel, FiberCon, MiraLax, etc) 1-2 times a day regularly will usually help prevent this problem from occurring.  A mild laxative (prune juice, Milk of Magnesia, MiraLax, etc) should be taken according to package directions if there are no bowel movements after 48 hours.   ° °5. Wash / shower every   day.  You may shower over the dressings as they are waterproof.    6. Remove your waterproof bandages, skin tapes, and other bandages 5 days after surgery. You may replace a dressing/Band-Aid to cover the incision for comfort if you wish. You may leave the incisions open to air.  You may replace a dressing/Band-Aid to cover an incision for comfort if you wish.   Continue to shower over incision(s) after the dressing is off.  7. ACTIVITIES as tolerated:   a. You may resume regular (light) daily activities beginning the next day--such as daily self-care, walking, climbing stairs--gradually increasing activities as tolerated.  Control your pain so that you can walk an hour a day.  If you can walk 30 minutes without difficulty, it is safe to try more intense activity such as jogging, treadmill, bicycling, low-impact aerobics, swimming, etc. b. Save the most intensive and strenuous activity for last such as sit-ups, heavy lifting, contact sports, etc  Refrain from any heavy lifting or straining until you are off narcotics for pain control.   c. DO NOT PUSH THROUGH PAIN.  Let pain be your guide: If it hurts to do something, don't do it.  Pain is your body warning you to avoid that activity for another week until the pain goes down. d. You may drive when you are no longer taking prescription pain medication, you can comfortably wear a seatbelt, and you can safely maneuver your car and apply brakes. e. Bonita Quin may have sexual intercourse when it is comfortable.   8. FOLLOW UP in our office a. Please call [facility] at [phone] to set up an appointment to see your surgeon in the office for a follow-up appointment approximately 2-3 weeks after your surgery. b. Make sure that you call for this appointment the day you arrive home to insure a convenient appointment time.  9.  If you have disability of FMLA / Family leave forms, please bring the forms to the office for processing.  (do not give to your surgeon).  WHEN TO CALL us [phone]: 1. Poor pain control 2. Reactions / problems with new medications (rash/itching, nausea, etc)  3. Fever over 101.5 F (38.5 C) 4. Inability to urinate 5. Nausea and/or vomiting 6. Worsening swelling or bruising 7. Continued bleeding from incision. 8. Increased pain, redness, or drainage from the incision   The clinic staff is  available to answer your questions during regular business hours (8:30am-5pm).  Please dont hesitate to call and ask to speak to one of our nurses for clinical concerns.   If you have a medical emergency, go to the nearest emergency room or call 911.  A surgeon from [facility] is always on call at the hospitals in [facility] [place] [address], [address], [place], [place]  [address] ?  [address] [address], [place], [place]   [address] MAIN: [phone] ? TOLL FREE: [phone] ? FAX: [phone] [email or web address]   Inguinal Hernia, Adult An inguinal hernia is when fat or the intestines push through the area where the leg meets the lower belly (groin) and make a rounded lump (bulge). This condition happens over time. There are three types of inguinal hernias. These types include:  Hernias that can be pushed back into the belly (are reducible).  Hernias that cannot be pushed back into the belly (are incarcerated).  Hernias that cannot be pushed back into the belly and lose their blood supply (get strangulated). This type needs emergency  surgery.  Follow these instructions at home: Lifestyle  Drink enough fluid to keep your urine (pee) clear or pale yellow.  Eat plenty of fruits, vegetables, and whole grains. These have a lot of fiber. Talk with your doctor if you have questions.  Avoid lifting heavy objects.  Avoid standing for long periods of time.  Do not use tobacco products. These include cigarettes, chewing tobacco, or e-cigarettes. If you need help quitting, ask your doctor.  Try to stay at a healthy weight. General instructions  Do not try to force the hernia back in.  Watch your hernia for any changes in color or size. Let your doctor know if there are any changes.  Take over-the-counter and prescription medicines only as told by your doctor.  Keep all follow-up visits as told by your doctor. This is  important. Contact a doctor if:  You have a fever.  You have new symptoms.  Your symptoms get worse. Get help right away if:  The area where the legs meets the lower belly has: ? Pain that gets worse suddenly. ? A bulge that gets bigger suddenly and does not go down. ? A bulge that turns red or purple. ? A bulge that is painful to the touch.  You are a man and your scrotum: ? Suddenly feels painful. ? Suddenly changes in size.  You feel sick to your stomach (nauseous) and this feeling does not go away.  You throw up (vomit) and this keeps happening.  You feel your heart beating a lot more quickly than normal.  You cannot poop (have a bowel movement) or pass gas. This information is not intended to replace advice given to you by your health care provider. Make sure you discuss any questions you have with your health care provider. Document Released: 03/15/2006 Document Revised: 07/21/2015 Document Reviewed: 12/23/2013 Elsevier Interactive Patient Education  2018 [employer].

## 2017-10-29 ENCOUNTER — Encounter (HOSPITAL_COMMUNITY): Payer: Self-pay | Admitting: Surgery

## 2018-06-17 ENCOUNTER — Ambulatory Visit (HOSPITAL_COMMUNITY)
Admission: EM | Admit: 2018-06-17 | Discharge: 2018-06-17 | Disposition: A | Discharge status: Home / Self Care | Payer: Self-pay | Attending: Family Medicine | Admitting: Family Medicine

## 2018-06-17 ENCOUNTER — Encounter (HOSPITAL_COMMUNITY): Payer: Self-pay

## 2018-06-17 ENCOUNTER — Other Ambulatory Visit: Payer: Self-pay

## 2018-06-17 DIAGNOSIS — J029 Acute pharyngitis, unspecified: Secondary | ICD-10-CM

## 2018-06-17 LAB — POCT RAPID STREP A: Streptococcus, Group A Screen (Direct): NEGATIVE

## 2018-06-17 MED ORDER — AMOXICILLIN 500 MG PO CAPS: 500.0000 mg | ORAL_CAPSULE | Freq: Three times a day (TID) | ORAL | 0 refills | Status: AC

## 2018-06-17 NOTE — ED Triage Notes (Signed)
Sore throat began aprx 3 days ago, denies SOB or cough
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
## 2018-06-17 NOTE — Discharge Instructions (Signed)
Strep test negative, will send out for culture and we will call you with results However, based on history and physical exam, I will treat you for potential strep throat Push fluids and get rest Prescribed amoxicillin 500mg  twice daily for 10 days.  Take as directed and to completion.  Drink warm or cool liquids, use throat lozenges, or popsicles to help alleviate symptoms Take OTC ibuprofen or tylenol as needed for pain Follow up with PCP or [facility] if symptoms persist CALL or go to ER if you have any new or worsening symptoms such as fever, chills, nausea, vomiting, worsening sore throat, difficulty swallowing, difficulty breathing, cough, shortness of breath, chest pressure, abdominal pain, chest pain, changes in bowel or bladder habits, etc..Marland Kitchen

## 2018-06-19 ENCOUNTER — Telehealth (HOSPITAL_COMMUNITY): Payer: Self-pay | Admitting: Emergency Medicine

## 2018-06-19 LAB — CULTURE, GROUP A STREP (THRC)

## 2018-06-19 NOTE — Telephone Encounter (Signed)
Given amoxicillin, patient called and made aware, all questions answered.

## 2019-03-24 ENCOUNTER — Inpatient Hospital Stay (HOSPITAL_COMMUNITY)
Admission: RE | Admit: 2019-03-24 | Discharge: 2019-03-27 | DRG: 897 | Payer: Self-pay | Attending: Psychiatry | Admitting: Psychiatry

## 2019-03-24 ENCOUNTER — Encounter (HOSPITAL_COMMUNITY): Payer: Self-pay | Admitting: Psychiatry

## 2019-03-24 ENCOUNTER — Other Ambulatory Visit: Payer: Self-pay

## 2019-03-24 DIAGNOSIS — R45851 Suicidal ideations: Secondary | ICD-10-CM | POA: Diagnosis present

## 2019-03-24 DIAGNOSIS — F17210 Nicotine dependence, cigarettes, uncomplicated: Secondary | ICD-10-CM | POA: Diagnosis present

## 2019-03-24 DIAGNOSIS — F321 Major depressive disorder, single episode, moderate: Secondary | ICD-10-CM

## 2019-03-24 DIAGNOSIS — I10 Essential (primary) hypertension: Secondary | ICD-10-CM | POA: Diagnosis present

## 2019-03-24 DIAGNOSIS — F1414 Cocaine abuse with cocaine-induced mood disorder: Principal | ICD-10-CM | POA: Diagnosis present

## 2019-03-24 DIAGNOSIS — Z20822 Contact with and (suspected) exposure to covid-19: Secondary | ICD-10-CM | POA: Diagnosis present

## 2019-03-24 DIAGNOSIS — Z915 Personal history of self-harm: Secondary | ICD-10-CM

## 2019-03-24 HISTORY — DX: Essential (primary) hypertension: I10

## 2019-03-24 LAB — RESPIRATORY PANEL BY RT PCR (FLU A&B, COVID)
Influenza A by PCR: NEGATIVE
Influenza B by PCR: NEGATIVE
SARS Coronavirus 2 by RT PCR: NEGATIVE
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
## 2019-03-24 NOTE — Progress Notes (Signed)
Patient ID: Alan Mullen, male   DOB: Jan 15, 1971, 49 y.o.   MRN: [id number] Pt A&O x 4, presents with SI, related to crack cocaine drug use.  Denies SI, HI or AVH upon assessment.  Pt reports he drinks 4/40 oz beers per week and last used crack cocaine at 11am this morning.  Skin search completed, pending COVID results.  Monitoring for safety, calm & cooperative, interactive with staff.

## 2019-03-25 DIAGNOSIS — F1414 Cocaine abuse with cocaine-induced mood disorder: Principal | ICD-10-CM

## 2019-03-25 DIAGNOSIS — F321 Major depressive disorder, single episode, moderate: Secondary | ICD-10-CM

## 2019-03-25 LAB — CBC WITH DIFFERENTIAL/PLATELET
Abs Immature Granulocytes: 0.03 10*3/uL (ref 0.00–0.07)
Basophils Absolute: 0 10*3/uL (ref 0.0–0.1)
Basophils Relative: 0 %
Eosinophils Absolute: 0.1 10*3/uL (ref 0.0–0.5)
Eosinophils Relative: 1 %
HCT: 44.4 % (ref 39.0–52.0)
Hemoglobin: 13.8 g/dL (ref 13.0–17.0)
Immature Granulocytes: 0 %
Lymphocytes Relative: 26 %
Lymphs Abs: 2.9 10*3/uL (ref 0.7–4.0)
MCH: 26.7 pg (ref 26.0–34.0)
MCHC: 31.1 g/dL (ref 30.0–36.0)
MCV: 85.9 fL (ref 80.0–100.0)
Monocytes Absolute: 0.8 10*3/uL (ref 0.1–1.0)
Monocytes Relative: 8 %
Neutro Abs: 7 10*3/uL (ref 1.7–7.7)
Neutrophils Relative %: 65 %
Platelets: 309 10*3/uL (ref 150–400)
RBC: 5.17 MIL/uL (ref 4.22–5.81)
RDW: 14 % (ref 11.5–15.5)
WBC: 10.9 10*3/uL — ABNORMAL HIGH (ref 4.0–10.5)
nRBC: 0 % (ref 0.0–0.2)

## 2019-03-25 LAB — COMPREHENSIVE METABOLIC PANEL
ALT: 18 U/L (ref 0–44)
AST: 23 U/L (ref 15–41)
Albumin: 4 g/dL (ref 3.5–5.0)
Alkaline Phosphatase: 74 U/L (ref 38–126)
Anion gap: 11 (ref 5–15)
BUN: 9 mg/dL (ref 6–20)
CO2: 23 mmol/L (ref 22–32)
Calcium: 9.2 mg/dL (ref 8.9–10.3)
Chloride: 105 mmol/L (ref 98–111)
Creatinine, Ser: 1.16 mg/dL (ref 0.61–1.24)
GFR calc Af Amer: >60 mL/min (ref >60)
GFR calc non Af Amer: >60 mL/min (ref >60)
Glucose, Bld: 94 mg/dL (ref 70–99)
Potassium: 4 mmol/L (ref 3.5–5.1)
Sodium: 139 mmol/L (ref 135–145)
Total Bilirubin: 0.7 mg/dL (ref 0.3–1.2)
Total Protein: 7.7 g/dL (ref 6.5–8.1)

## 2019-03-25 LAB — GLUCOSE, CAPILLARY: Glucose-Capillary: 82 mg/dL (ref 70–99)

## 2019-03-25 LAB — URINALYSIS, COMPLETE (UACMP) WITH MICROSCOPIC
Bacteria, UA: NONE SEEN
Bilirubin Urine: NEGATIVE
Glucose, UA: NEGATIVE mg/dL
Hgb urine dipstick: NEGATIVE
Ketones, ur: NEGATIVE mg/dL
Nitrite: NEGATIVE
Protein, ur: NEGATIVE mg/dL
Specific Gravity, Urine: 1.021 (ref 1.005–1.030)
pH: 5 (ref 5.0–8.0)

## 2019-03-25 LAB — MAGNESIUM: Magnesium: 2.5 mg/dL — ABNORMAL HIGH (ref 1.7–2.4)

## 2019-03-25 LAB — HEPATIC FUNCTION PANEL
ALT: 20 U/L (ref 0–44)
AST: 24 U/L (ref 15–41)
Albumin: 4.1 g/dL (ref 3.5–5.0)
Alkaline Phosphatase: 73 U/L (ref 38–126)
Bilirubin, Direct: 0.1 mg/dL (ref 0.0–0.2)
Indirect Bilirubin: 0.7 mg/dL (ref 0.3–0.9)
Total Bilirubin: 0.8 mg/dL (ref 0.3–1.2)
Total Protein: 7.5 g/dL (ref 6.5–8.1)

## 2019-03-25 LAB — HEMOGLOBIN A1C
Hgb A1c MFr Bld: 5.5 % (ref 4.8–5.6)
Mean Plasma Glucose: 111.15 mg/dL

## 2019-03-25 LAB — LIPID PANEL
Cholesterol: 197 mg/dL (ref 0–200)
HDL: 72 mg/dL (ref >40)
Total CHOL/HDL Ratio: 2.7 RATIO
Triglycerides: 127 mg/dL (ref <150)
VLDL: 25 mg/dL (ref 0–40)

## 2019-03-25 LAB — RAPID URINE DRUG SCREEN, HOSP PERFORMED
Amphetamines: NOT DETECTED
Barbiturates: NOT DETECTED
Benzodiazepines: NOT DETECTED
Cocaine: POSITIVE — AB
Opiates: NOT DETECTED
Tetrahydrocannabinol: POSITIVE — AB

## 2019-03-25 LAB — ETHANOL: Alcohol, Ethyl (B): <10 mg/dL (ref <10)

## 2019-03-25 LAB — TSH: TSH: 0.309 u[IU]/mL — ABNORMAL LOW (ref 0.350–4.500)

## 2019-03-25 MED ORDER — HYDROXYZINE HCL 25 MG PO TABS
25.0000 mg | ORAL_TABLET | Freq: Three times a day (TID) | ORAL | Status: DC | PRN
  Filled 2019-03-25: qty 20

## 2019-03-25 MED ORDER — TRAZODONE HCL 50 MG PO TABS
50.0000 mg | ORAL_TABLET | Freq: Every evening | ORAL | Status: DC | PRN
  Filled 2019-03-25: qty 14

## 2019-03-25 MED ORDER — ACETAMINOPHEN 325 MG PO TABS
650.0000 mg | ORAL_TABLET | Freq: Four times a day (QID) | ORAL | Status: DC | PRN
  Administered 2019-03-26: 08:00:00 650 mg via ORAL
  Filled 2019-03-25: qty 2

## 2019-03-25 MED ORDER — ALUM & MAG HYDROXIDE-SIMETH 200-200-20 MG/5ML PO SUSP: 30.0000 mL | ORAL | Status: DC | PRN

## 2019-03-25 MED ORDER — MIRTAZAPINE 15 MG PO TABS
15.0000 mg | ORAL_TABLET | Freq: Every day | ORAL | Status: DC
  Administered 2019-03-25 – 2019-03-26 (×2): 15 mg via ORAL
  Filled 2019-03-25: qty 1
  Filled 2019-03-25: qty 14
  Filled 2019-03-25: qty 1

## 2019-03-25 MED ORDER — MAGNESIUM HYDROXIDE 400 MG/5ML PO SUSP: 30.0000 mL | Freq: Every day | ORAL | Status: DC | PRN

## 2019-03-25 NOTE — Progress Notes (Signed)
   03/25/19 0616  Vital Signs  Temp 98.4 F (36.9 C)  Pulse Rate (!) 59  BP (!) 142/100  BP Method Automatic  Oxygen Therapy  SpO2 100 %   Pt BP still elevated this morning. Pt denies headache, dizziness, or palpitations.

## 2019-03-25 NOTE — BHH Group Notes (Signed)
LCSW Group Therapy Note  03/25/2019 2:01 PM  Type of Therapy/Topic: Group Therapy: Feelings about Diagnosis  Participation Level: Did Not Attend   Description of Group:  This group will allow patients to explore their thoughts and feelings about diagnoses they have received. Patients will be guided to explore their level of understanding and acceptance of these diagnoses. Facilitator will encourage patients to process their thoughts and feelings about the reactions of others to their diagnosis and will guide patients in identifying ways to discuss their diagnosis with significant others in their lives. This group will be process-oriented, with patients participating in exploration of their own experiences, giving and receiving support, and processing challenge from other group members.  Therapeutic Goals: 1. Patient will demonstrate understanding of diagnosis as evidenced by identifying two or more symptoms of the disorder 2. Patient will be able to express two feelings regarding the diagnosis 3. Patient will demonstrate their ability to communicate their needs through discussion and/or role play  Summary of Patient Progress:    Therapeutic Modalities:  Cognitive Behavioral Therapy Brief Therapy Feelings Identification   Enid Cutter, MSW, [facility] Clinical Social Worker

## 2019-03-25 NOTE — BHH Counselor (Signed)
This patient has been accepted for residential substance use treatment at [facility] in [facility] on Friday, 01/29.  Patient will need to arrive by 7:45am.  Patient will need a 28 day supply of medications.  Enid Cutter, MSW, LCSW-A Clinical Social Worker [facility]  [phone]

## 2019-03-25 NOTE — Progress Notes (Signed)
   03/25/19 0041  Vital Signs  Pulse Rate 60  Pulse Rate Source Dinamap  BP (!) 141/86  BP Location Left Arm  BP Method Automatic  Patient Position (if appropriate) Sitting  Blood pressure noted to be high upon admission to Observation Unit. BP reassessed upon transfer to 300 Unit. Pt blood pressure still borderline high. Pt states never been diagnosed with HTN. Pt denies headache, dizziness or palpitations.

## 2019-03-25 NOTE — Progress Notes (Signed)
Adult Psychoeducational Group Note  Date:  03/25/2019 Time:  8:51 PM  Group Topic/Focus:  Wrap-Up Group:   The focus of this group is to help patients review their daily goal of treatment and discuss progress on daily workbooks.  Participation Level:  Active  Participation Quality:  Appropriate  Affect:  Appropriate  Cognitive:  Appropriate  Insight: Appropriate  Engagement in Group:  Engaged  Modes of Intervention:  Discussion  Additional Comments:  Patient attended group and participated.  Jaimy Kliethermes W Leoma Folds 03/25/2019, 8:51 PM
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
## 2019-03-25 NOTE — Tx Team (Signed)
Interdisciplinary Treatment and Diagnostic Plan Update  03/25/2019 Time of Session: 9:00am Alan Mullen MRN: [id number]  Principal Diagnosis: Cocaine abuse with cocaine-induced mood disorder ([place])  Secondary Diagnoses: Principal Problem:   Cocaine abuse with cocaine-induced mood disorder ([place])   Current Medications:  Current Facility-Administered Medications  Medication Dose Route Frequency Provider Last Rate Last Admin  . acetaminophen (TYLENOL) tablet 650 mg  650 mg Oral Q6H PRN Anike, Adaku C, NP      . alum & mag hydroxide-simeth (MAALOX/MYLANTA) 200-200-20 MG/5ML suspension 30 mL  30 mL Oral Q4H PRN Anike, Adaku C, NP      . hydrOXYzine (ATARAX/VISTARIL) tablet 25 mg  25 mg Oral TID PRN Anike, Adaku C, NP      . magnesium hydroxide (MILK OF MAGNESIA) suspension 30 mL  30 mL Oral Daily PRN Anike, Adaku C, NP      . mirtazapine (REMERON) tablet 15 mg  15 mg Oral QHS Sharma Covert, MD      . traZODone (DESYREL) tablet 50 mg  50 mg Oral QHS PRN Anike, Adaku C, NP       PTA Medications: No medications prior to admission.    Patient Stressors: Substance abuse Other: Life issues  Patient Strengths: Ability for insight Average or above average intelligence Motivation for treatment/growth Supportive family/friends  Treatment Modalities: Medication Management, Group therapy, Case management,  1 to 1 session with clinician, Psychoeducation, Recreational therapy.   Physician Treatment Plan for Primary Diagnosis: Cocaine abuse with cocaine-induced mood disorder ([place]) Long Term Goal(s): Improvement in symptoms so as ready for discharge Improvement in symptoms so as ready for discharge   Short Term Goals: Ability to identify changes in lifestyle to reduce recurrence of condition will improve Ability to demonstrate self-control will improve Ability to identify and develop effective coping behaviors will improve Ability to maintain clinical measurements within normal limits will  improve Ability to identify triggers associated with substance abuse/mental health issues will improve Ability to identify changes in lifestyle to reduce recurrence of condition will improve Ability to demonstrate self-control will improve Ability to identify and develop effective coping behaviors will improve Ability to maintain clinical measurements within normal limits will improve Ability to identify triggers associated with substance abuse/mental health issues will improve  Medication Management: Evaluate patient's response, side effects, and tolerance of medication regimen.  Therapeutic Interventions: 1 to 1 sessions, Unit Group sessions and Medication administration.  Evaluation of Outcomes: Not Met  Physician Treatment Plan for Secondary Diagnosis: Principal Problem:   Cocaine abuse with cocaine-induced mood disorder ([place])  Long Term Goal(s): Improvement in symptoms so as ready for discharge Improvement in symptoms so as ready for discharge   Short Term Goals: Ability to identify changes in lifestyle to reduce recurrence of condition will improve Ability to demonstrate self-control will improve Ability to identify and develop effective coping behaviors will improve Ability to maintain clinical measurements within normal limits will improve Ability to identify triggers associated with substance abuse/mental health issues will improve Ability to identify changes in lifestyle to reduce recurrence of condition will improve Ability to demonstrate self-control will improve Ability to identify and develop effective coping behaviors will improve Ability to maintain clinical measurements within normal limits will improve Ability to identify triggers associated with substance abuse/mental health issues will improve     Medication Management: Evaluate patient's response, side effects, and tolerance of medication regimen.  Therapeutic Interventions: 1 to 1 sessions, Unit Group sessions  and Medication administration.  Evaluation of Outcomes: Not Met  RN Treatment Plan for Primary Diagnosis: Cocaine abuse with cocaine-induced mood disorder ([place]) Long Term Goal(s): Knowledge of disease and therapeutic regimen to maintain health will improve  Short Term Goals: Ability to verbalize feelings will improve and Ability to identify and develop effective coping behaviors will improve  Medication Management: RN will administer medications as ordered by provider, will assess and evaluate patient's response and provide education to patient for prescribed medication. RN will report any adverse and/or side effects to prescribing provider.  Therapeutic Interventions: 1 on 1 counseling sessions, Psychoeducation, Medication administration, Evaluate responses to treatment, Monitor vital signs and CBGs as ordered, Perform/monitor CIWA, COWS, AIMS and Fall Risk screenings as ordered, Perform wound care treatments as ordered.  Evaluation of Outcomes: Not Met   LCSW Treatment Plan for Primary Diagnosis: Cocaine abuse with cocaine-induced mood disorder ([place]) Long Term Goal(s): Safe transition to appropriate next level of care at discharge, Engage patient in therapeutic group addressing interpersonal concerns.  Short Term Goals: Engage patient in aftercare planning with referrals and resources, Increase social support, Increase emotional regulation, Identify triggers associated with mental health/substance abuse issues and Increase skills for wellness and recovery  Therapeutic Interventions: Assess for all discharge needs, 1 to 1 time with Social worker, Explore available resources and support systems, Assess for adequacy in community support network, Educate family and significant other(s) on suicide prevention, Complete Psychosocial Assessment, Interpersonal group therapy.  Evaluation of Outcomes: Not Met  Progress in Treatment: Attending groups: No. New to unit. Participating in groups:  No. Taking medication as prescribed: Yes. Toleration medication: Yes. Family/Significant other contact made: No, will contact:  supports if consents granted. Patient understands diagnosis: Yes. Discussing patient identified problems/goals with staff: Yes. Medical problems stabilized or resolved: Yes. Denies suicidal/homicidal ideation: No. Issues/concerns per patient self-inventory: Yes.  New problem(s) identified: Yes, Describe:  family conflict.  New Short Term/Long Term Goal(s): detox, medication management for mood stabilization; elimination of SI thoughts; development of comprehensive mental wellness/sobriety plan.  Patient Goals:  "Not killing myself."  Discharge Plan or Barriers: Patient newly admitted to unit, CSW assessing for appropriate referrals.   Reason for Continuation of Hospitalization: Anxiety Depression Medication stabilization Suicidal ideation Withdrawal symptoms  Estimated Length of Stay: 3-5 days   Attendees: Patient: Alan Mullen 03/25/2019 9:09 AM  Physician: Queen Blossom 03/25/2019 9:09 AM  Nursing: Legrand Como, RN 03/25/2019 9:09 AM  RN Care Manager: 03/25/2019 9:09 AM  Social Worker: Stephanie Acre, [place] 03/25/2019 9:09 AM  Recreational Therapist:  03/25/2019 9:09 AM  Other:  03/25/2019 9:09 AM  Other:  03/25/2019 9:09 AM  Other: 03/25/2019 9:09 AM    Scribe for Treatment Team: Joellen Jersey, [place] 03/25/2019 9:09 AM
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
## 2019-03-25 NOTE — Plan of Care (Signed)
Progress note  D: pt found in bed; allowed to rest with no morning medications due. Upon awakening, pt is pleasant. Pt is sad, sullen, anxious, and depressed. On approach. Pt states they are feeling better than yesterday though. Pt continues to isolate. Pt denies any physical symptoms or pain. Pt endorses hearing voices though not this morning. Pt denies si/hi/ah/vh and verbally agrees to approach staff if these become apparent or before harming themself/others while at bhh.  A: Pt provided support and encouragement. Pt given medication per protocol and standing orders. Q57m safety checks implemented and continued.  R: Pt safe on the unit. Will continue to monitor.  Pt progressing in the following metrics  Problem: Education: Goal: Knowledge of [facility] Education information/materials will improve Outcome: Progressing Goal: Emotional status will improve Outcome: Progressing Goal: Mental status will improve Outcome: Progressing Goal: Verbalization of understanding the information provided will improve Outcome: Progressing

## 2019-03-25 NOTE — Tx Team (Signed)
Initial Treatment Plan 03/25/2019 1:27 AM Alan Mullen TML:[id number]    PATIENT STRESSORS: Substance abuse Other: Life issues   PATIENT STRENGTHS: Ability for insight Average or above average intelligence Motivation for treatment/growth Supportive family/friends   PATIENT IDENTIFIED PROBLEMS: Substance abuse-crack cocaine addiction  Alcohol abuse  Suicidal ideation d/t substance abuse                 DISCHARGE CRITERIA:  Ability to meet basic life and health needs Improved stabilization in mood, thinking, and/or behavior Motivation to continue treatment in a less acute level of care Verbal commitment to aftercare and medication compliance  PRELIMINARY DISCHARGE PLAN: Attend PHP/IOP Outpatient therapy Return to previous living arrangement  PATIENT/FAMILY INVOLVEMENT: This treatment plan has been presented to and reviewed with the patient, Alan Mullen, and/or family member.  The patient and family have been given the opportunity to ask questions and make suggestions.  Victorino December, RN 03/25/2019, 1:27 AM

## 2019-03-25 NOTE — Progress Notes (Signed)
Patient ID: Alan Mullen, male   DOB: April 11, 1970, 49 y.o.   MRN: [id number] D: Pt was transported from Observation unit to 302-1. Pt denies SI, HI, AVH and pain at this time. Pt expresses desire to get off of crack cocaine. Pt lives with his mother and she is his main social support right now. Pt is here voluntarily. Pt wants to work on getting off of crack cocaine and getting his life together. A: Pt was offered support and encouragement. Pt signed admission paperwork. Height and weight assessed. Belongings searched and contraband items placed in locker. Pt offered food and drink and food taken. Pt introduced to unit milieu by nursing staff. Q 15 minute checks were started for safety.  R: Pt safety maintained on unit.

## 2019-03-25 NOTE — Progress Notes (Signed)
   03/25/19 0930  Psych Admission Type (Psych Patients Only)  Admission Status Voluntary  Psychosocial Assessment  Patient Complaints Anxiety;Depression;Sadness;Worrying  Eye Contact Fair  Facial Expression Anxious;Sullen;Sad  Affect Anxious;Depressed;Sad;Sullen  Speech Logical/coherent  Interaction Assertive  Motor Activity Slow  Appearance/Hygiene Disheveled;In scrubs  Behavior Characteristics Cooperative;Appropriate to situation;Anxious  Mood Depressed;Anxious;Sad;Sullen;Pleasant  Thought Process  Coherency WDL  Content WDL  Delusions None reported or observed  Perception Hallucinations  Hallucination Auditory  Judgment Poor  Confusion Mild  Danger to Self  Current suicidal ideation? Passive  Self-Injurious Behavior Some self-injurious ideation observed or expressed.  No lethal plan expressed   Agreement Not to Harm Self Yes  Description of Agreement Verbal  Danger to Others  Danger to Others None reported or observed

## 2019-03-25 NOTE — Progress Notes (Signed)
Progress note  Urine specimen cup provided to patient with instructions

## 2019-03-25 NOTE — BHH Suicide Risk Assessment (Signed)
[facility] Admission Suicide Risk Assessment   Nursing information obtained from:  Patient, Review of record Demographic factors:  Male Current Mental Status:  Suicidal ideation indicated by patient Loss Factors:  NA Historical Factors:  Impulsivity Risk Reduction Factors:  Religious beliefs about death, Positive social support, Positive therapeutic relationship, Sense of responsibility to family, Living with another person, especially a relative, Positive coping skills or problem solving skills  Total Time spent with patient: 30 minutes Principal Problem: Cocaine abuse with cocaine-induced mood disorder ([place]) Diagnosis:  Principal Problem:   Cocaine abuse with cocaine-induced mood disorder ([place])  Subjective Data: Patient is seen and examined.  Patient is a 49 year old male with a past psychiatric history significant for cocaine dependence who presented to the behavioral health hospital as a voluntary evaluation secondary to suicidal ideation.  The patient stated that a friend of his had given him a bottle of Tylenol PM, and he took 3 tablets and what was stated to be a suicide attempt.  "I got tired of doing drugs and didn't want to live anymore".  The patient has a greater than one decade use of cocaine.  He stated he feels helpless, hopeless and worthless.  He stated that he has been unable to get out of bed for the last 2 weeks.  He admitted suicidal ideation.  He stated that he is never been admitted to the psychiatric hospital before, and is never been treated with psychiatric medications.  The last time he was in substance rehabilitation was at approximately 49 years of age.  He currently lives with his mother, and denied any other stressors.  He denied any court dates, homelessness or other issues.  He was admitted to the hospital for evaluation and stabilization.  He did state that family members had been attempting to get him into a rehabilitation facility last weekend, but there were no available  beds at that time.  Continued Clinical Symptoms:  Alcohol Use Disorder Identification Test Final Score (AUDIT): 27 The "Alcohol Use Disorders Identification Test", Guidelines for Use in Primary Care, Second Edition.  World Pharmacologist [facility]). Score between 0-7:  no or low risk or alcohol related problems. Score between 8-15:  moderate risk of alcohol related problems. Score between 16-19:  high risk of alcohol related problems. Score 20 or above:  warrants further diagnostic evaluation for alcohol dependence and treatment.   CLINICAL FACTORS:   Depression:   Anhedonia Comorbid alcohol abuse/dependence Hopelessness Impulsivity Insomnia Alcohol/Substance Abuse/Dependencies   Musculoskeletal: Strength & Muscle Tone: within normal limits Gait & Station: normal Patient leans: N/A  Psychiatric Specialty Exam: Physical Exam  Nursing note and vitals reviewed. Constitutional: He is oriented to person, place, and time. He appears well-developed and well-nourished.  HENT:  Head: Normocephalic and atraumatic.  Respiratory: Effort normal.  Neurological: He is alert and oriented to person, place, and time.    Review of Systems  Blood pressure (!) 136/101, pulse 79, temperature 98.4 F (36.9 C), resp. rate 16, height 6\' 1"  (1.854 m), weight 87.5 kg, SpO2 100 %.Body mass index is 25.46 kg/m.  General Appearance: Disheveled  Eye Contact:  Fair  Speech:  Normal Rate  Volume:  Normal  Mood:  Anxious  Affect:  Congruent  Thought Process:  Coherent and Descriptions of Associations: Circumstantial  Orientation:  Full (Time, Place, and Person)  Thought Content:  Logical  Suicidal Thoughts:  Yes.  without intent/plan  Homicidal Thoughts:  No  Memory:  Immediate;   Fair Recent;   Fair Remote;  Fair  Judgement:  Intact  Insight:  Lacking  Psychomotor Activity:  Normal  Concentration:  Concentration: Fair and Attention Span: Fair  Recall:  [employer] of Knowledge:  Fair   Language:  Good  Akathisia:  Negative  Handed:  Right  AIMS (if indicated):     Assets:  Desire for Improvement Resilience  ADL's:  Intact  Cognition:  WNL  Sleep:  Number of Hours: 4.25(admitted after midnight)      COGNITIVE FEATURES THAT CONTRIBUTE TO RISK:  None    SUICIDE RISK:   Minimal: No identifiable suicidal ideation.  Patients presenting with no risk factors but with morbid ruminations; may be classified as minimal risk based on the severity of the depressive symptoms  PLAN OF CARE: Patient is seen and examined.  Patient is a 49 year old male with the above-stated past psychiatric history who was admitted secondary to suicidal ideation.  He will be admitted to the unit.  He will be integrated into the milieu.  He will be encouraged to attend groups.  He will be started on Remeron 15 mg p.o. nightly for sleep as well as mood and anxiety.  The patient would like to get into a substance rehabilitation program, and he will discuss this with social work.  Review of his laboratories revealed essentially normal electrolytes, his lipid panel is pending.  Hemoglobin A1c was 5.5, TSH was 0.309.  Blood alcohol was less than 10.  Drug screen is pending.  His EKG showed a sinus bradycardia with right atrial enlargement.  His QTC was within normal limits.  His blood pressure is mildly elevated at 136/101 this morning.  His rate is 79.  He is not taking any current medications, but if his blood pressure remains elevated we may have to go on and treat that.  I certify that inpatient services furnished can reasonably be expected to improve the patient's condition.   Antonieta Pert, MD 03/25/2019, 9:10 AM

## 2019-03-26 MED ORDER — HYDROCHLOROTHIAZIDE 12.5 MG PO CAPS: 12.5000 mg | ORAL_CAPSULE | Freq: Every day | ORAL | 0 refills | Status: DC

## 2019-03-26 MED ORDER — TRAZODONE HCL 50 MG PO TABS: 50.0000 mg | ORAL_TABLET | Freq: Every evening | ORAL | 0 refills | Status: DC | PRN

## 2019-03-26 MED ORDER — HYDROXYZINE HCL 25 MG PO TABS: 25.0000 mg | ORAL_TABLET | Freq: Three times a day (TID) | ORAL | 0 refills | Status: DC | PRN

## 2019-03-26 MED ORDER — HYDROCHLOROTHIAZIDE 12.5 MG PO CAPS
12.5000 mg | ORAL_CAPSULE | Freq: Every day | ORAL | Status: DC
  Administered 2019-03-26: 10:00:00 12.5 mg via ORAL
  Filled 2019-03-26 (×4): qty 1

## 2019-03-26 MED ORDER — MIRTAZAPINE 15 MG PO TABS: 15.0000 mg | ORAL_TABLET | Freq: Every day | ORAL | 0 refills | Status: DC
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
## 2019-03-26 NOTE — Progress Notes (Signed)
This patient has a residential substance use treatment intake appointment tomorrow morning at 7:45am.  A taxi voucher for this patient will be placed on his chart.  Patient needs to discharge tomorrow morning no later than 7am.  Night shift staff need to call [employer] company to arrange transportation.  Enid Cutter, LCSW-A Clinical Social Worker

## 2019-03-26 NOTE — Progress Notes (Signed)
   03/26/19 2236  Psych Admission Type (Psych Patients Only)  Admission Status Voluntary  Psychosocial Assessment  Patient Complaints None  Eye Contact Fair  Facial Expression Anxious  Affect Anxious  Speech Logical/coherent  Interaction Assertive  Motor Activity Slow  Appearance/Hygiene Unremarkable  Behavior Characteristics Cooperative  Mood Pleasant  Thought Process  Content WDL  Delusions None reported or observed  Perception WDL  Hallucination None reported or observed  Judgment Poor  Confusion None  Danger to Self  Current suicidal ideation? Denies  Agreement Not to Harm Self Yes  Description of Agreement Verbal  Danger to Others  Danger to Others None reported or observed   Pt denies SI, HI, AVH and pain. Pt ready to leave in am. Going to [facility]. Pt contracts for safety.

## 2019-03-26 NOTE — Progress Notes (Signed)
  [facility] Adult Case Management Discharge Plan :  Will you be returning to the same living situation after discharge:  No. Entering residential substance use treatment. At discharge, do you have transportation home?: Yes,  taxi voucher on chart. Needs to leave at 7am. Do you have the ability to pay for your medications: No. Provided medication samples.  Release of information consent forms completed and in the chart.  Patient to Follow up at: Follow-up Information    [facility]. Go on 03/27/2019.   Why: Accepted for admission on Friday, 03/27/2019 at 7:45am. Please be sure to bring any discharge paperwork from this hospitalization. Also be sure to bring your medications, prescriptions and belongings Contact information: [address] [place] [address] [phone]        [facility] Follow up.   Why: Please follow up for outpatient services once you complete the program at [facility]. Walk-in hours are Monday- Friday from 8:00am-5:00pm. Be sure to bring any discharge paperwork and list of medications. Be sure to wear a mask.  Contact information: [address] [place] [address] [phone]           Next level of care provider has access to [facility] Link:no  Safety Planning and Suicide Prevention discussed: Yes,  with mother, [name].  Have you used any form of tobacco in the last 30 days? (Cigarettes, Smokeless Tobacco, Cigars, and/or Pipes): Yes  Has patient been referred to the Quitline?: Patient refused referral  Patient has been referred for addiction treatment: Yes  [name], LCSWA 03/26/2019, 3:11 PM

## 2019-03-26 NOTE — [facility] Suicide Risk Assessment (Signed)
[facility] Discharge Suicide Risk Assessment   Principal Problem: Major depressive disorder, single episode, moderate degree ([facility]) Discharge Diagnoses: Principal Problem:   Major depressive disorder, single episode, moderate degree ([facility]) Active Problems:   Cocaine abuse with cocaine-induced mood disorder ([facility])   Total Time spent with patient: 15 minutes  Musculoskeletal: Strength & Muscle Tone: within normal limits Gait & Station: normal Patient leans: N/A  Psychiatric Specialty Exam: Review of Systems  All other systems reviewed and are negative.   Blood pressure (!) 127/112, pulse 86, temperature 98.4 F (36.9 C), resp. rate 16, height 6\' 1"  (1.854 m), weight 87.5 kg, SpO2 98 %.Body mass index is 25.46 kg/m.  General Appearance: Casual  Eye Contact::  Fair  Speech:  Normal Rate409  Volume:  Normal  Mood:  Anxious  Affect:  Congruent  Thought Process:  Coherent and Descriptions of Associations: Intact  Orientation:  Full (Time, Place, and Person)  Thought Content:  Logical  Suicidal Thoughts:  No  Homicidal Thoughts:  No  Memory:  Immediate;   Fair Recent;   Fair Remote;   Fair  Judgement:  Fair  Insight:  Fair  Psychomotor Activity:  Normal  Concentration:  Fair  Recall:  [id number] of Knowledge:Fair  Language: Good  Akathisia:  Negative  Handed:  Right  AIMS (if indicated):     Assets:  Desire for Improvement Resilience  Sleep:  Number of Hours: 6  Cognition: WNL  ADL's:  Intact   Mental Status Per Nursing Assessment::   On Admission:  Suicidal ideation indicated by patient  Demographic Factors:  Male, Divorced or widowed, Low socioeconomic status and Unemployed  Loss Factors: Financial problems/change in socioeconomic status  Historical Factors: Impulsivity  Risk Reduction Factors:   NA  Continued Clinical Symptoms:  Alcohol/Substance Abuse/Dependencies  Cognitive Features That Contribute To Risk:  None    Suicide Risk:  Minimal: No  identifiable suicidal ideation.  Patients presenting with no risk factors but with morbid ruminations; may be classified as minimal risk based on the severity of the depressive symptoms  Follow-up Information    [facility]. Go on 03/27/2019.   Why: Accepted for admission on Friday, 03/27/2019 at 7:45am. Please be sure to bring any discharge paperwork from this hospitalization. Also be sure to bring your medications, prescriptions and belongings Contact information: [address] [place] [phone]        [facility] Follow up.   Why: Please follow up for outpatient services once you complete the program at [facility]. Walk-in hours are Monday- Friday from 8:00am-5:00pm. Be sure to bring any discharge paperwork and list of medications. Be sure to wear a mask.  Contact information: [address] [place] [phone]           Plan Of Care/Follow-up recommendations:  Activity:  ad lib  [phone], MD 03/26/2019, 9:51 AM

## 2019-03-26 NOTE — [facility] Suicide Risk Assessment (Signed)
[facility] INPATIENT:  Family/Significant Other Suicide Prevention Education  Suicide Prevention Education:  Education Completed;  mother, Alan Mullen [phone] has been identified by the patient as the family member/significant other with whom the patient will be residing, and identified as the person(s) who will aid the patient in the event of a mental health crisis (suicidal ideations/suicide attempt).  With written consent from the patient, the family member/significant other has been provided the following suicide prevention education, prior to the and/or following the discharge of the patient.  The suicide prevention education provided includes the following:  Suicide risk factors  Suicide prevention and interventions  National Suicide Hotline telephone number  [facility] assessment telephone number  [facility] Emergency Assistance 911  [facility] and/or Residential Mobile Crisis Unit telephone number  Request made of family/significant other to:  Remove weapons (e.g., guns, rifles, knives), all items previously/currently identified as safety concern.    Remove drugs/medications (over-the-counter, prescriptions, illicit drugs), all items previously/currently identified as a safety concern.  The family member/significant other verbalizes understanding of the suicide prevention education information provided.  The family member/significant other agrees to remove the items of safety concern listed above.   Mother is glad patient is here and invested in treatment. She has worried about his depression and drug use and reports it worsened in the last few months.  She is aware he has a residential screening at [facility] in [facility] tomorrow morning at 7:45am. She is pleased that he is continuing on with additional treatment.  Mother has no additional questions or concerns.  Alan Mullen 03/26/2019, 1:36 PM

## 2019-03-26 NOTE — Progress Notes (Signed)
Adult Psychoeducational Group Note  Date:  03/26/2019 Time:  9:12 PM  Group Topic/Focus:  Wrap-Up Group:   The focus of this group is to help patients review their daily goal of treatment and discuss progress on daily workbooks.  Participation Level:  Active  Participation Quality:  Appropriate  Affect:  Appropriate  Cognitive:  Appropriate  Insight: Appropriate  Engagement in Group:  Engaged  Modes of Intervention:  Discussion  Additional Comments:  Patient attended group and said that his day was a 9.  His goal for today is to stay clean.  Patient said he is being discharged tomorrow.    Bing Duffey W Dudley Mages 03/26/2019, 9:12 PM
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
## 2019-03-26 NOTE — Progress Notes (Signed)
D:  Patient denied SI and HI, contracts for safety.  Denied A/V hallucinations.  Denied pain. A:  Medications administered per MD orders.  Emotional support and encouragement given patient. R:  Safety maintained with 15 minute checks.  

## 2019-03-27 NOTE — Progress Notes (Signed)
Patient ID: Alan Mullen, male   DOB: [birth year], 49 y.o.   MRN: [id number] Nurs Dischg Note:   D:Patient appears calm and cooperative, and no distress noted.    A: AVS reviewed with pt. All personal items in locker returned to pt. Pt escorted to lobby to wait for taxi to [facility].   R:  Pt states he will comply with outpatient services, and take medications as prescribed.

## 2019-09-24 ENCOUNTER — Emergency Department (HOSPITAL_COMMUNITY)
Admission: EM | Admit: 2019-09-24 | Discharge: 2019-09-25 | Disposition: A | Discharge status: Other Acute Inpatient Hospital | Payer: Self-pay | Attending: Emergency Medicine | Admitting: Emergency Medicine

## 2019-09-24 ENCOUNTER — Encounter (HOSPITAL_COMMUNITY): Payer: Self-pay | Admitting: Emergency Medicine

## 2019-09-24 DIAGNOSIS — R45851 Suicidal ideations: Secondary | ICD-10-CM | POA: Insufficient documentation

## 2019-09-24 DIAGNOSIS — Z20822 Contact with and (suspected) exposure to covid-19: Secondary | ICD-10-CM | POA: Insufficient documentation

## 2019-09-24 DIAGNOSIS — F17210 Nicotine dependence, cigarettes, uncomplicated: Secondary | ICD-10-CM | POA: Insufficient documentation

## 2019-09-24 DIAGNOSIS — Z79899 Other long term (current) drug therapy: Secondary | ICD-10-CM | POA: Insufficient documentation

## 2019-09-24 DIAGNOSIS — F332 Major depressive disorder, recurrent severe without psychotic features: Secondary | ICD-10-CM | POA: Insufficient documentation

## 2019-09-24 DIAGNOSIS — Z046 Encounter for general psychiatric examination, requested by authority: Secondary | ICD-10-CM | POA: Insufficient documentation

## 2019-09-24 DIAGNOSIS — I10 Essential (primary) hypertension: Secondary | ICD-10-CM | POA: Insufficient documentation

## 2019-09-24 LAB — CBC WITH DIFFERENTIAL/PLATELET
Abs Immature Granulocytes: 0.03 10*3/uL (ref 0.00–0.07)
Basophils Absolute: 0 10*3/uL (ref 0.0–0.1)
Basophils Relative: 0 %
Eosinophils Absolute: 0.2 10*3/uL (ref 0.0–0.5)
Eosinophils Relative: 2 %
HCT: 39.7 % (ref 39.0–52.0)
Hemoglobin: 13.2 g/dL (ref 13.0–17.0)
Immature Granulocytes: 0 %
Lymphocytes Relative: 21 %
Lymphs Abs: 1.9 10*3/uL (ref 0.7–4.0)
MCH: 28 pg (ref 26.0–34.0)
MCHC: 33.2 g/dL (ref 30.0–36.0)
MCV: 84.1 fL (ref 80.0–100.0)
Monocytes Absolute: 0.8 10*3/uL (ref 0.1–1.0)
Monocytes Relative: 9 %
Neutro Abs: 6.1 10*3/uL (ref 1.7–7.7)
Neutrophils Relative %: 68 %
Platelets: 293 10*3/uL (ref 150–400)
RBC: 4.72 MIL/uL (ref 4.22–5.81)
RDW: 13.5 % (ref 11.5–15.5)
WBC: 9.2 10*3/uL (ref 4.0–10.5)
nRBC: 0 % (ref 0.0–0.2)

## 2019-09-24 LAB — ETHANOL: Alcohol, Ethyl (B): <10 mg/dL (ref <10)

## 2019-09-24 LAB — SALICYLATE LEVEL: Salicylate Lvl: <7 mg/dL — ABNORMAL LOW (ref 7.0–30.0)

## 2019-09-24 LAB — BASIC METABOLIC PANEL
Anion gap: 9 (ref 5–15)
BUN: 9 mg/dL (ref 6–20)
CO2: 30 mmol/L (ref 22–32)
Calcium: 9 mg/dL (ref 8.9–10.3)
Chloride: 102 mmol/L (ref 98–111)
Creatinine, Ser: 1.09 mg/dL (ref 0.61–1.24)
GFR calc Af Amer: >60 mL/min (ref >60)
GFR calc non Af Amer: >60 mL/min (ref >60)
Glucose, Bld: 81 mg/dL (ref 70–99)
Potassium: 4.1 mmol/L (ref 3.5–5.1)
Sodium: 141 mmol/L (ref 135–145)

## 2019-09-24 LAB — ACETAMINOPHEN LEVEL: Acetaminophen (Tylenol), Serum: <10 ug/mL — ABNORMAL LOW (ref 10–30)

## 2019-09-24 LAB — RAPID URINE DRUG SCREEN, HOSP PERFORMED
Amphetamines: NOT DETECTED
Barbiturates: NOT DETECTED
Benzodiazepines: NOT DETECTED
Cocaine: POSITIVE — AB
Opiates: NOT DETECTED
Tetrahydrocannabinol: POSITIVE — AB

## 2019-09-24 LAB — SARS CORONAVIRUS 2 BY RT PCR (HOSPITAL ORDER, PERFORMED IN ~~LOC~~ HOSPITAL LAB): SARS Coronavirus 2: NEGATIVE

## 2019-09-24 NOTE — BH Assessment (Signed)
Clinician contacted pt's nurse in an attempt to complete pt's BH Assessment. Pt's nurse, Denyse Amass RN, checked pt and stated pt is asleep and cannot be aroused at this time. Pt's nurse took clinician's number and stated they will call when pt is awake.

## 2019-09-24 NOTE — ED Triage Notes (Signed)
Per IVC paperwork, states patient uses crack cocaine-patient is homeless, will occasionally go to mother's house to use shower-states he hallucinates, will run out of house naked-refuses to take meds
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
## 2019-09-25 ENCOUNTER — Encounter (HOSPITAL_COMMUNITY): Payer: Self-pay | Admitting: Behavioral Health

## 2019-09-25 ENCOUNTER — Other Ambulatory Visit: Payer: Self-pay

## 2019-09-25 ENCOUNTER — Inpatient Hospital Stay (HOSPITAL_COMMUNITY)
Admission: AD | Admit: 2019-09-25 | Discharge: 2019-09-29 | DRG: 885 | Disposition: A | Discharge status: Home / Self Care | Payer: Federal, State, Local not specified - Other | Attending: Psychiatry | Admitting: Psychiatry

## 2019-09-25 DIAGNOSIS — Z79899 Other long term (current) drug therapy: Secondary | ICD-10-CM | POA: Diagnosis not present

## 2019-09-25 DIAGNOSIS — I10 Essential (primary) hypertension: Secondary | ICD-10-CM | POA: Diagnosis present

## 2019-09-25 DIAGNOSIS — R45851 Suicidal ideations: Secondary | ICD-10-CM | POA: Diagnosis present

## 2019-09-25 DIAGNOSIS — F1494 Cocaine use, unspecified with cocaine-induced mood disorder: Secondary | ICD-10-CM | POA: Diagnosis present

## 2019-09-25 DIAGNOSIS — Z9114 Patient's other noncompliance with medication regimen: Secondary | ICD-10-CM

## 2019-09-25 DIAGNOSIS — F17210 Nicotine dependence, cigarettes, uncomplicated: Secondary | ICD-10-CM | POA: Diagnosis present

## 2019-09-25 DIAGNOSIS — F419 Anxiety disorder, unspecified: Secondary | ICD-10-CM | POA: Diagnosis present

## 2019-09-25 DIAGNOSIS — F332 Major depressive disorder, recurrent severe without psychotic features: Secondary | ICD-10-CM | POA: Diagnosis present

## 2019-09-25 DIAGNOSIS — G4700 Insomnia, unspecified: Secondary | ICD-10-CM | POA: Diagnosis present

## 2019-09-25 DIAGNOSIS — F1210 Cannabis abuse, uncomplicated: Secondary | ICD-10-CM | POA: Diagnosis present

## 2019-09-25 DIAGNOSIS — Z590 Homelessness: Secondary | ICD-10-CM

## 2019-09-25 MED ORDER — THIAMINE HCL 100 MG/ML IJ SOLN
100.0000 mg | Freq: Once | INTRAMUSCULAR | Status: AC
  Administered 2019-09-25: 100 mg via INTRAMUSCULAR
  Filled 2019-09-25: qty 2

## 2019-09-25 MED ORDER — THIAMINE HCL 100 MG PO TABS
100.0000 mg | ORAL_TABLET | Freq: Every day | ORAL | Status: DC
  Administered 2019-09-26 – 2019-09-29 (×4): 100 mg via ORAL
  Filled 2019-09-25 (×5): qty 1

## 2019-09-25 MED ORDER — LORAZEPAM 1 MG PO TABS: 1.0000 mg | ORAL_TABLET | Freq: Four times a day (QID) | ORAL | Status: AC | PRN

## 2019-09-25 MED ORDER — ADULT MULTIVITAMIN W/MINERALS CH
1.0000 | ORAL_TABLET | Freq: Every day | ORAL | Status: DC
  Administered 2019-09-25 – 2019-09-29 (×5): 1 via ORAL
  Filled 2019-09-25 (×7): qty 1

## 2019-09-25 MED ORDER — ZIPRASIDONE MESYLATE 20 MG IM SOLR: 20.0000 mg | INTRAMUSCULAR | Status: DC | PRN

## 2019-09-25 MED ORDER — ACETAMINOPHEN 325 MG PO TABS: 650.0000 mg | ORAL_TABLET | Freq: Four times a day (QID) | ORAL | Status: DC | PRN

## 2019-09-25 MED ORDER — ONDANSETRON 4 MG PO TBDP: 4.0000 mg | ORAL_TABLET | Freq: Four times a day (QID) | ORAL | Status: DC | PRN

## 2019-09-25 MED ORDER — HYDROCHLOROTHIAZIDE 12.5 MG PO CAPS
12.5000 mg | ORAL_CAPSULE | Freq: Every day | ORAL | Status: DC
  Administered 2019-09-25 – 2019-09-26 (×2): 12.5 mg via ORAL
  Filled 2019-09-25 (×4): qty 1

## 2019-09-25 MED ORDER — HYDROXYZINE HCL 25 MG PO TABS: 25.0000 mg | ORAL_TABLET | Freq: Three times a day (TID) | ORAL | Status: DC | PRN

## 2019-09-25 MED ORDER — LORAZEPAM 1 MG PO TABS: 1.0000 mg | ORAL_TABLET | ORAL | Status: DC | PRN

## 2019-09-25 MED ORDER — LOPERAMIDE HCL 2 MG PO CAPS: 2.0000 mg | ORAL_CAPSULE | ORAL | Status: DC | PRN

## 2019-09-25 MED ORDER — MIRTAZAPINE 7.5 MG PO TABS
7.5000 mg | ORAL_TABLET | Freq: Every day | ORAL | Status: DC
  Administered 2019-09-25: 7.5 mg via ORAL
  Filled 2019-09-25 (×4): qty 1

## 2019-09-25 MED ORDER — HYDROXYZINE HCL 25 MG PO TABS
25.0000 mg | ORAL_TABLET | Freq: Four times a day (QID) | ORAL | Status: AC | PRN
  Administered 2019-09-27 (×2): 25 mg via ORAL
  Filled 2019-09-25 (×2): qty 1

## 2019-09-25 MED ORDER — OLANZAPINE 10 MG PO TBDP: 10.0000 mg | ORAL_TABLET | Freq: Three times a day (TID) | ORAL | Status: DC | PRN

## 2019-09-25 MED ORDER — TRAZODONE HCL 50 MG PO TABS: 50.0000 mg | ORAL_TABLET | Freq: Every evening | ORAL | Status: DC | PRN
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
## 2019-09-25 NOTE — ED Notes (Signed)
Pt has been accepted at [facility] and can arrive at 0300. This information was provided to pt's nurse, Coy Saunas, at [phone].  Room: 305-1 Accepting: Renaye Rakers, NP Attending: Dr. Nehemiah Massed, MD Call to Report: [phone]

## 2019-09-25 NOTE — [facility] Suicide Risk Assessment (Signed)
[facility] INPATIENT:  Family/Significant Other Suicide Prevention Education  Suicide Prevention Education:  Contact Attempts: Alan Mullen, mother, [phone] has been identified by the patient as the family member/significant other with whom the patient will be residing, and identified as the person(s) who will aid the patient in the event of a mental health crisis.  With written consent from the patient, two attempts were made to provide suicide prevention education, prior to and/or following the patient's discharge.  We were unsuccessful in providing suicide prevention education.  A suicide education pamphlet was given to the patient to share with family/significant other.  Date and time of first attempt: 09/25/19 at 4:02pm Date and time of second attempt: Second attempt is needed.  CSW was unable to leave a HIPAA compliant voicemail as the voicemail box has not been set up.   [name] 09/25/2019, 4:01 PM

## 2019-09-25 NOTE — Tx Team (Cosign Needed)
Interdisciplinary Treatment and Diagnostic Plan Update  09/25/2019 Time of Session: 11 33am Alan Mullen MRN: [id number]  Principal Diagnosis: <principal problem not specified>  Secondary Diagnoses: Active Problems:   Major depressive disorder, recurrent episode, severe (HCC)   Current Medications:  Current Facility-Administered Medications  Medication Dose Route Frequency Provider Last Rate Last Admin  . acetaminophen (TYLENOL) tablet 650 mg  650 mg Oral Q6H PRN Anike, Adaku C, NP      . hydrOXYzine (ATARAX/VISTARIL) tablet 25 mg  25 mg Oral TID PRN Armando Reichert, MD      . traZODone (DESYREL) tablet 50 mg  50 mg Oral QHS PRN Armando Reichert, MD       PTA Medications: Medications Prior to Admission  Medication Sig Dispense Refill Last Dose  . hydrochlorothiazide (MICROZIDE) 12.5 MG capsule Take 1 capsule (12.5 mg total) by mouth daily. For high blood pressure (Patient not taking: Reported on 09/24/2019) 30 capsule 0   . hydrOXYzine (ATARAX/VISTARIL) 25 MG tablet Take 1 tablet (25 mg total) by mouth 3 (three) times daily as needed for anxiety. (Patient not taking: Reported on 09/24/2019) 75 tablet 0   . mirtazapine (REMERON) 15 MG tablet Take 1 tablet (15 mg total) by mouth at bedtime. For depression/sleep (Patient not taking: Reported on 09/24/2019) 30 tablet 0   . traZODone (DESYREL) 50 MG tablet Take 1 tablet (50 mg total) by mouth at bedtime as needed for sleep. (Patient not taking: Reported on 09/24/2019) 30 tablet 0     Patient Stressors:    Patient Strengths:    Treatment Modalities: Medication Management, Group therapy, Case management,  1 to 1 session with clinician, Psychoeducation, Recreational therapy.   Physician Treatment Plan for Primary Diagnosis: <principal problem not specified> Long Term Goal(s): Improvement in symptoms so as ready for discharge Improvement in symptoms so as ready for discharge   Short Term Goals: Ability to identify changes in lifestyle to reduce  recurrence of condition will improve Ability to verbalize feelings will improve Ability to disclose and discuss suicidal ideas Ability to demonstrate self-control will improve Ability to identify and develop effective coping behaviors will improve Ability to maintain clinical measurements within normal limits will improve Compliance with prescribed medications will improve Ability to identify triggers associated with substance abuse/mental health issues will improve Ability to identify changes in lifestyle to reduce recurrence of condition will improve Ability to verbalize feelings will improve Ability to disclose and discuss suicidal ideas Ability to demonstrate self-control will improve Ability to identify and develop effective coping behaviors will improve Ability to maintain clinical measurements within normal limits will improve Compliance with prescribed medications will improve Ability to identify triggers associated with substance abuse/mental health issues will improve  Medication Management: Evaluate patient's response, side effects, and tolerance of medication regimen.  Therapeutic Interventions: 1 to 1 sessions, Unit Group sessions and Medication administration.  Evaluation of Outcomes: Not Met  Physician Treatment Plan for Secondary Diagnosis: Active Problems:   Major depressive disorder, recurrent episode, severe ([place])  Long Term Goal(s): Improvement in symptoms so as ready for discharge Improvement in symptoms so as ready for discharge   Short Term Goals: Ability to identify changes in lifestyle to reduce recurrence of condition will improve Ability to verbalize feelings will improve Ability to disclose and discuss suicidal ideas Ability to demonstrate self-control will improve Ability to identify and develop effective coping behaviors will improve Ability to maintain clinical measurements within normal limits will improve Compliance with prescribed medications will  improve Ability to identify  triggers associated with substance abuse/mental health issues will improve Ability to identify changes in lifestyle to reduce recurrence of condition will improve Ability to verbalize feelings will improve Ability to disclose and discuss suicidal ideas Ability to demonstrate self-control will improve Ability to identify and develop effective coping behaviors will improve Ability to maintain clinical measurements within normal limits will improve Compliance with prescribed medications will improve Ability to identify triggers associated with substance abuse/mental health issues will improve     Medication Management: Evaluate patient's response, side effects, and tolerance of medication regimen.  Therapeutic Interventions: 1 to 1 sessions, Unit Group sessions and Medication administration.  Evaluation of Outcomes: Not Met   RN Treatment Plan for Primary Diagnosis: <principal problem not specified> Long Term Goal(s): Knowledge of disease and therapeutic regimen to maintain health will improve  Short Term Goals: Ability to remain free from injury will improve, Ability to verbalize frustration and anger appropriately will improve, Ability to demonstrate self-control, Ability to participate in decision making will improve, Ability to verbalize feelings will improve, Ability to disclose and discuss suicidal ideas, Ability to identify and develop effective coping behaviors will improve and Compliance with prescribed medications will improve  Medication Management: RN will administer medications as ordered by provider, will assess and evaluate patient's response and provide education to patient for prescribed medication. RN will report any adverse and/or side effects to prescribing provider.  Therapeutic Interventions: 1 on 1 counseling sessions, Psychoeducation, Medication administration, Evaluate responses to treatment, Monitor vital signs and CBGs as ordered,  Perform/monitor CIWA, COWS, AIMS and Fall Risk screenings as ordered, Perform wound care treatments as ordered.  Evaluation of Outcomes: Not Met   LCSW Treatment Plan for Primary Diagnosis: <principal problem not specified> Long Term Goal(s): Safe transition to appropriate next level of care at discharge, Engage patient in therapeutic group addressing interpersonal concerns.  Short Term Goals: Engage patient in aftercare planning with referrals and resources, Increase social support, Increase ability to appropriately verbalize feelings, Increase emotional regulation, Facilitate acceptance of mental health diagnosis and concerns, Facilitate patient progression through stages of change regarding substance use diagnoses and concerns, Identify triggers associated with mental health/substance abuse issues and Increase skills for wellness and recovery  Therapeutic Interventions: Assess for all discharge needs, 1 to 1 time with Social worker, Explore available resources and support systems, Assess for adequacy in community support network, Educate family and significant other(s) on suicide prevention, Complete Psychosocial Assessment, Interpersonal group therapy.  Evaluation of Outcomes: Not Met   Progress in Treatment: Attending groups: No. Participating in groups: No. Taking medication as prescribed: No. Toleration medication: n/a Family/Significant other contact made: No, will contact:  family Patient understands diagnosis: Yes. Discussing patient identified problems/goals with staff: Yes. Medical problems stabilized or resolved: Yes. Denies suicidal/homicidal ideation: Yes. Issues/concerns per patient self-inventory: Yes. Other:   New problem(s) identified: No New Short Term/Long Term Goal(s):  Patient Goals:  "Get Clean" pt wants residential tx.   Discharge Plan or Barriers:   Reason for Continuation of Hospitalization: Medication stabilization  Estimated Length of  Stay:  Attendees: Patient: Alan Mullen 09/25/2019 1:21 PM  Physician: Neita Garnet 09/25/2019 1:21 PM  Nursing:  09/25/2019 1:21 PM  RN Care Manager: 09/25/2019 1:21 PM  Social Worker:  Macon Large 09/25/2019 1:21 PM  Recreational Therapist:  09/25/2019 1:21 PM  Other:  09/25/2019 1:21 PM  Other:  09/25/2019 1:21 PM  Other: 09/25/2019 1:21 PM    Scribe for Treatment Team: Bethann Berkshire, LCSW 09/25/2019 1:25 PM
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
## 2019-09-25 NOTE — Progress Notes (Signed)
Patient admitted from [facility] ED for suicidal ideation to OD on crack cocaine. His main stressors are that he is homeless and he is tired of living and he want s to kill himself.  He was searched on admission to unit for contraband and none was found. Upon admission he was guarded and didn't want in depth questioning. He is currently in bed resting at this time.

## 2019-09-25 NOTE — Progress Notes (Signed)
Recreation Therapy Notes  Date: 7.30.21 Time: 0930 Location: 300 Hall Dayroom  Group Topic: Stress Management  Goal Area(s) Addresses:  Patient will identify positive stress management techniques. Patient will identify benefits of using stress management post d/c.  Intervention: Stress Management  Activity:  Meditation.  LRT played a meditation that focused on making choices.  Patients were to listen and follow along as meditation was played to engage in activity.    Education:  Stress Management, Discharge Planning.   Education Outcome: Acknowledges Education  Clinical Observations/Feedback: Pt did not attend group activity.    August Longest, LRT/CTRS         Reynalda Canny A 09/25/2019 11:27 AM 

## 2019-09-25 NOTE — [facility] Suicide Risk Assessment (Addendum)
[facility] Admission Suicide Risk Assessment   Nursing information obtained from:   patient and chart  Demographic factors:  Male, Low socioeconomic status, Living alone Current Mental Status:  NA Loss Factors:  NA Historical Factors:  Family history of suicide Risk Reduction Factors:  Positive social support  Total Time spent with patient: 45 minutes Principal Problem:  Cocaine /Alcohol Use Disorder. Substance Induced Mood Disorder versus MDD Diagnosis:  Active Problems:   Major depressive disorder, recurrent episode, severe (HCC)  Subjective Data:   Continued Clinical Symptoms:  Alcohol Use Disorder Identification Test Final Score (AUDIT): 0 The "Alcohol Use Disorders Identification Test", Guidelines for Use in Primary Care, Second Edition.  World Science writer [facility]). Score between 0-7:  no or low risk or alcohol related problems. Score between 8-15:  moderate risk of alcohol related problems. Score between 16-19:  high risk of alcohol related problems. Score 20 or above:  warrants further diagnostic evaluation for alcohol dependence and treatment.   CLINICAL FACTORS:  48, single , has 4 adult children, homeless/at times stays with mother, currently unemployed   He presented on 7/29 under involuntary commitment , reporting that patient has been hallucinating , running around naked and reporting visual hallucinations, expressing suicidal ideations.  He reports he has been depressed and endorses neuro-vegetative symptoms such as anhedonia, hypersomnia , low energy level, and suicidal ideations. He states he has been feeling suicidal " for a while" and states he has had thoughts of overdosing.  He also reports both auditory and visual hallucinations. States he hears voices telling him to kill himself and also sometimes sees his deceased sister.  He has a history of cocaine use disorder , and has been using regularly/ often daily. He also reports he has been drinking regularly, up to a  bottle of vodka per episode . He reports he last drank " a few days ago".  Admission UDS positive for cocaine and THC, BAL negative.   Patient reports history of depression and a history of psychotic symptoms/hallucinations. He has a history of prior psychiatric admissions, and was admitted to [facility] in January 2021 , for depression, suicidal ideations, cocaine use disorder. At the time was discharged on Remeron 15 mgrs QHS. He states that depression persists even when depressed .  He reports past history of suicidal attempt by overdosing. Does not currently  endorse PTSD history .  Medical History- HTN, was treated with HCTZ 12.5 mgrs during prior admission.  Smokes 1 PPD. NKDA.  He has been not taking any medications over the last several months.  7/30 EKG QTc 428.   Dx- Cocaine Use Disorder, Alcohol Use Disorder, Substance Induced Mood Disorder   Plan- Inpatient admission. * patient was treated with Remeron during past psychiatric admission, and remembers this medication as helpful. Will restart at 7.5 mgrs QHS * currently he is not presenting with significant symptoms of alcohol WDL and admission BAL was negative on 7/29. Will start Ativan PRN for alcohol WDL as per CIWA protocol if needed  * no psychotic symptoms or presentation today. Psychosis may be substance induced . Will not start standing antipsychotic at this time. Zyprexa /Geodon PRN for psychosis/agitation * will restart HCTZ 12.5 mgrs QDAY for HTN. BP tody 139/101    Musculoskeletal: Strength & Muscle Tone: within normal limits Gait & Station: normal Patient leans: N/A  Psychiatric Specialty Exam: Physical Exam  Review of Systems denies headache, no chest pain , no shortness of breath, no coughing, no vomiting   Blood pressure Marland Kitchen)  139/101, pulse 85, temperature 98.4 F (36.9 C), temperature source Oral.There is no height or weight on file to calculate BMI.  General Appearance: Fairly Groomed  Eye Contact:  Fair   Speech:  Normal Rate  Volume:  Normal  Mood:  Dysphoric/Depressed   Affect:  Congruent  Thought Process:  Linear and Descriptions of Associations: Intact  Orientation:  Other:  fully alert and attentive  Thought Content:  reports auditory and visual hallucinations, but none today , does not appear internally preoccupied at present. No delusions currently expresse d  Suicidal Thoughts:  No denies suicidal ideations at this time and contracts for safety on unit. Denies homicidal or violent ideations  Homicidal Thoughts:  No  Memory:  recent and remote grossly intact   Judgement:  Fair  Insight:  Fair  Psychomotor Activity:  Normal- no psychomotor agitation. No current tremors or diaphoresis, no restlessness or psychomotor agitation   Concentration:  Concentration: Fair and Attention Span: Fair  Recall:  Good  Fund of Knowledge:  Good  Language:  Good  Akathisia:  Negative  Handed:  Right  AIMS (if indicated):     Assets:  Communication Skills Desire for Improvement Resilience  ADL's:  Intact  Cognition:  WNL  Sleep:  Number of Hours: 1.25      COGNITIVE FEATURES THAT CONTRIBUTE TO RISK:  Closed-mindedness, Loss of executive function and Polarized thinking    SUICIDE RISK:   Moderate:  Frequent suicidal ideation with limited intensity, and duration, some specificity in terms of plans, no associated intent, good self-control, limited dysphoria/symptomatology, some risk factors present, and identifiable protective factors, including available and accessible social support.  PLAN OF CARE: Patient will be admitted to inpatient psychiatric unit for stabilization and safety. Will provide and encourage milieu participation. Provide medication management and maked adjustments as needed. Will also prescribe medication to address risk of WDL , as needed-  Will follow daily.    I certify that inpatient services furnished can reasonably be expected to improve the patient's condition.    Craige Cotta, MD 09/25/2019, 5:38 PM

## 2019-09-25 NOTE — BH Specialist Note (Signed)
Pt has been accepted at [facility] and can arrive at 0300. This information was provided to pt's nurse, Corey RN, at 0156.  Room: 305-1 Accepting: Adaku Anike, NP Attending: Dr. Fernando Cobos, MD Call to Report: [phone] 

## 2019-09-25 NOTE — Progress Notes (Signed)
   09/25/19 0832  Vital Signs  BP (!) 139/101  BP Location Left Arm  BP Method Automatic  Patient Position (if appropriate) Sitting  Pain Assessment  Pain Scale 0-10  Pain Score 0   D: Patient out in open areas briefly for vitals. Patient denies SI/HI/AVH. Patient rated anxiety and depression 6/10. A:  Support and encouragement provided Routine safety checks conducted every 15 minutes. Patient  Informed to notify staff with any concerns.  R:  Safety maintained.

## 2019-09-25 NOTE — Progress Notes (Signed)
   09/25/19 2233  Psych Admission Type (Psych Patients Only)  Admission Status Involuntary  Psychosocial Assessment  Patient Complaints Depression  Eye Contact Fair  Facial Expression Flat  Affect Appropriate to circumstance  Speech Logical/coherent  Interaction Assertive  Appearance/Hygiene Unremarkable  Behavior Characteristics Cooperative;Appropriate to situation  Mood Pleasant;Anxious  Thought Process  Content WDL  Delusions WDL  Perception WDL  Hallucination None reported or observed  Judgment WDL  Confusion WDL  Danger to Self  Current suicidal ideation? Denies  Danger to Others  Danger to Others None reported or observed

## 2019-09-25 NOTE — Plan of Care (Signed)
Patient newly admitted and requires stabilization of mental health symptoms.
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
## 2019-09-26 DIAGNOSIS — R45851 Suicidal ideations: Secondary | ICD-10-CM

## 2019-09-26 LAB — LIPID PANEL
Cholesterol: 193 mg/dL (ref 0–200)
HDL: 57 mg/dL (ref >40)
LDL Cholesterol: 111 mg/dL — ABNORMAL HIGH (ref 0–99)
Total CHOL/HDL Ratio: 3.4 RATIO
Triglycerides: 126 mg/dL (ref <150)
VLDL: 25 mg/dL (ref 0–40)

## 2019-09-26 LAB — TSH: TSH: 0.489 u[IU]/mL (ref 0.350–4.500)

## 2019-09-26 LAB — HEMOGLOBIN A1C
Hgb A1c MFr Bld: 5.7 % — ABNORMAL HIGH (ref 4.8–5.6)
Mean Plasma Glucose: 116.89 mg/dL

## 2019-09-26 MED ORDER — MIRTAZAPINE 15 MG PO TABS
15.0000 mg | ORAL_TABLET | Freq: Every day | ORAL | Status: DC
  Administered 2019-09-26 – 2019-09-28 (×3): 15 mg via ORAL
  Filled 2019-09-26 (×6): qty 1

## 2019-09-26 MED ORDER — HYDROCHLOROTHIAZIDE 25 MG PO TABS
25.0000 mg | ORAL_TABLET | Freq: Every day | ORAL | Status: DC
  Administered 2019-09-27 – 2019-09-29 (×3): 25 mg via ORAL
  Filled 2019-09-26 (×4): qty 1

## 2019-09-26 MED ORDER — HYDROCHLOROTHIAZIDE 12.5 MG PO CAPS
12.5000 mg | ORAL_CAPSULE | ORAL | Status: AC
  Administered 2019-09-26: 12.5 mg via ORAL
  Filled 2019-09-26: qty 1

## 2019-09-26 MED ORDER — POTASSIUM CHLORIDE CRYS ER 20 MEQ PO TBCR
20.0000 meq | EXTENDED_RELEASE_TABLET | Freq: Once | ORAL | Status: AC
  Administered 2019-09-26: 20 meq via ORAL
  Filled 2019-09-26 (×2): qty 1

## 2019-09-26 NOTE — Progress Notes (Signed)
Pt attend wrap up group AA. 

## 2019-09-26 NOTE — Progress Notes (Signed)
   09/26/19 0641  Vital Signs  Pulse Rate 74  BP (!) 148/115  BP Location Right Arm  BP Method Automatic  Patient Position (if appropriate) Standing   D: Patient presents with a guarded affect. Patient denies SI/HI/AVH. Patient denies anxiety and depression. Patient has very brief eye contact. Patient is isolative in his room.  A:  Patient took scheduled medicine.  Support and encouragement provided Routine safety checks conducted every 15 minutes. Patient  Informed to notify staff with any concerns.   R:Safety maintained.

## 2019-09-26 NOTE — [facility] Group Notes (Signed)
Adult Psychoeducational Group Note  Date:  09/26/2019 Time:  12:30 PM  Group Topic/Focus:  Goals Group:   The focus of this group is to help patients establish daily goals to achieve during treatment and discuss how the patient can incorporate goal setting into their daily lives to aide in recovery.  Participation Level:  Did Not Attend   Dione Housekeeper 09/26/2019, 12:30 PM
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
## 2019-09-27 LAB — BASIC METABOLIC PANEL
Anion gap: 8 (ref 5–15)
BUN: 11 mg/dL (ref 6–20)
CO2: 29 mmol/L (ref 22–32)
Calcium: 9.4 mg/dL (ref 8.9–10.3)
Chloride: 102 mmol/L (ref 98–111)
Creatinine, Ser: 1.29 mg/dL — ABNORMAL HIGH (ref 0.61–1.24)
GFR calc Af Amer: >60 mL/min (ref >60)
GFR calc non Af Amer: >60 mL/min (ref >60)
Glucose, Bld: 110 mg/dL — ABNORMAL HIGH (ref 70–99)
Potassium: 4.3 mmol/L (ref 3.5–5.1)
Sodium: 139 mmol/L (ref 135–145)

## 2019-09-27 NOTE — [facility] Group Notes (Signed)
Adult Psychoeducational Group Note  Date:  09/27/2019 Time:  12:37 PM  Group Topic/Focus:   PROGRESSIVE RELAXATION. A group where deep breathing is taught and tensing and relaxation muscle groups is used. Imagery is used as well.  Pts are asked to imagine 3 pillars that hold them up when they are not able to hold themselves up.    Participation Level:  Minimal  Participation Quality:  Drowsy  Affect:  Flat  Cognitive:  Lacking  Insight: Lacking  Engagement in Group:  Lacking  Modes of Intervention:  Activity, Discussion, Education and Support  Additional Comments:  Pt stated he didn't want to talk or share anything with the group. Sat in the group the whole time, eyes closed, in a lying position. Did shake his head when asked if he was listening  Dione Housekeeper 09/27/2019, 12:37 PM

## 2019-09-27 NOTE — Plan of Care (Signed)
Patient stayed in bed and came out around medication time. Presented to the medication window to receive his sleeping medication: alert and oriented x4. Pleasant on approach. Denied suicidal thoughts. Denied hallucinations. Patient had a snack, stayed in the dayroom with peers until bedtime. Currently in bed sleeping. No sign of distress. Safety precautions maintained.

## 2019-09-27 NOTE — Progress Notes (Signed)
   09/27/19 2322  Psych Admission Type (Psych Patients Only)  Admission Status Involuntary  Psychosocial Assessment  Patient Complaints Anxiety  Eye Contact Fair  Facial Expression Flat  Affect Appropriate to circumstance  Speech Logical/coherent  Interaction Assertive  Appearance/Hygiene Improved  Behavior Characteristics Appropriate to situation  Mood Anxious  Thought Process  Coherency WDL  Content WDL  Delusions None reported or observed  Perception WDL  Hallucination None reported or observed  Judgment Impaired  Confusion None  Danger to Self  Current suicidal ideation? Denies  Self-Injurious Behavior No self-injurious ideation or behavior indicators observed or expressed   Agreement Not to Harm Self Yes  Description of Agreement verbal  Danger to Others  Danger to Others None reported or observed

## 2019-09-27 NOTE — Progress Notes (Addendum)
D: Patient's self inventory sheet, patient has poor sleep, no sleep medication given.  Poor appetite, low energy level, poor concentration.  Rated depression, hopeless and anxiety 10.  Denied withdrawals.  SI, contracts for safety, no plan.  Physical problems lightheaded, dizzy.  Denied physical pain.  Goal today is not killing himself.  No discharge plans. A:  Medications administered per MD orders. Emotional support and encouragement given patient. R:  Patient stated that he does have SI off/on, contracts for safety, no plan at [facility].  Denied HI.  Patient stated he does hear his dead sister say "don''t hurt yourself.  Also he does "see people every now and then."  Safety maintained with15 minute checks.  Marland Kitchen
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
## 2019-09-27 NOTE — Progress Notes (Signed)
Adult Psychoeducational Group Note  Date:  09/27/2019 Time:  9:06 PM  Group Topic/Focus:  Wrap-Up Group:   The focus of this group is to help patients review their daily goal of treatment and discuss progress on daily workbooks.  Participation Level:  Active  Participation Quality:  Appropriate  Affect:  Appropriate  Cognitive:  Appropriate  Insight: Appropriate  Engagement in Group:  Engaged  Modes of Intervention:  Discussion  Additional Comments:  Patient attended wrap up group and participated.   Arleen Bar W Montague Corella 09/27/2019, 9:06 PM

## 2019-09-27 NOTE — Plan of Care (Signed)
Nurse discussed anxiety, depression, coping skills with patient. 

## 2019-09-27 NOTE — [facility] Counselor (Signed)
CSW completed and faxed referrals for residential substance use treatment.   Referral faxed to [facility] (Authorization: [id number])  Referral faxed to [facility]  Patient declined interest in a referral being faxed to [facility].  Enid Cutter, MSW, LCSW-A Clinical Social Worker PRN
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
## 2019-09-27 NOTE — Progress Notes (Signed)
   09/27/19 2321  COVID-19 Daily Checkoff  Have you had a fever (temp > 37.80C/100F)  in the past 24 hours?  No  If you have had runny nose, nasal congestion, sneezing in the past 24 hours, has it worsened? No  COVID-19 EXPOSURE  Have you traveled outside the state in the past 14 days? No  Have you been in contact with someone with a confirmed diagnosis of COVID-19 or PUI in the past 14 days without wearing appropriate PPE? No  Have you been living in the same home as a person with confirmed diagnosis of COVID-19 or a PUI (household contact)? No  Have you been diagnosed with COVID-19? No

## 2019-09-27 NOTE — BHH Group Notes (Signed)
Date:  09/27/2019 Time:  12:57 PM  Group Topic/Focus:  Making Healthy Choices:   The focus of this group is to help patients identify negative/unhealthy choices they were using prior to admission and identify positive/healthier coping strategies to replace them upon discharge.  Participation Level:  Did Not Attend   Alan Mullen A 09/27/2019, 12:57 PM  
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
## 2019-09-28 NOTE — BHH Counselor (Signed)
CSW spoke with pt. Pt explains he does not want any residential tx. He states he was just doing it for his mother in order to return to live with her. He is aware that she does not want him to return to her house unless he has gone to residential tx. He then states that he will stay with his sister but has been unable to contact her so far and does not consent to CSW contacting her. Pt insists that he has somewhere to go upon discharge but does not offer specifics.

## 2019-09-28 NOTE — BHH Counselor (Signed)
CSW spoke with admissions at [facility]. CSW is informed that [facility] needs updated progress notes showing that pt no longer has SI/HI. Progress notes to be faxed to [phone]
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
## 2019-09-28 NOTE — BHH Counselor (Signed)
LCSW Group Therapy Notes  Type of Therapy and Topic: Group Therapy: Healthy Vs. Unhealthy Coping Strategies  Date and Time: 09/28/19 1pm  Participation Level: BHH PARTICIPATION LEVEL: Active  Description of Group: In this group, patients will be encouraged to explore their healthy and unhealthy coping strategics. Coping strategies are actions that we take to deal with stress, problems, or uncomfortable emotions in our daily lives. Each patient will be challenged to read some scenarios and discuss the unhealthy and healthy coping strategies within those scenarios. Also, each patient will be challenged to describe current healthy and unhealthy strategies that they use in their own lives and discuss the outcomes and barriers to those strategies. This group will be process-oriented, with patients participating in exploration of their own experiences as well as giving and receiving support and challenge from other group members.  Therapeutic Goals: Patient will identify personal healthy and unhealthy coping strategies. Patient will identify healthy and unhealthy coping strategies, in others, through scenarios. Patient will identify expected outcomes of healthy and unhealthy coping strategies. Patient will identify barriers to using healthy coping strategies.  Summary of Patient Progress: Pt is active at first though becomes more disengaged as session progressed. He is able to identify how substance use can be an unhealthy coping skill and specifically for him, how cocaine use has significant negative consequences.     Therapeutic Modalities:  Cognitive Behavioral Therapy Solution Focused Therapy Motivational Interviewing

## 2019-09-28 NOTE — Progress Notes (Signed)
   09/28/19 0621  Vital Signs  Pulse Rate 78  BP (!) 128/90  BP Method Automatic   D: Patient denies SI/HI but admits to hearing voices from people that are not present. Patient rated anxiety and depression both 6/10. Pt. Refused medicine for anxeity.  A:  Patient took scheduled medicine.  Support and encouragement provided Routine safety checks conducted every 15 minutes. Patient  Informed to notify staff with any concerns.   R:  Safety maintained.

## 2019-09-28 NOTE — Progress Notes (Signed)
Spiritual care group on grief and loss facilitated by chaplain Eden Rho  Group Goal:  Support / Education around grief and loss Members engage in facilitated group support and psycho-social education.  Group Description:  Following introductions and group rules, group members engaged in facilitated group dialog and support around topic of loss, with particular support around experiences of loss in their lives. Group Identified types of loss (relationships / self / things) and identified patterns, circumstances, and changes that precipitate losses. Reflected on thoughts / feelings around loss, normalized grief responses, and recognized variety in grief experience. Patient Progress:  DID NOT ATTEND 

## 2019-09-29 MED ORDER — ADULT MULTIVITAMIN W/MINERALS CH: 1.0000 | ORAL_TABLET | Freq: Every day | ORAL | 0 refills | Status: DC

## 2019-09-29 MED ORDER — MIRTAZAPINE 15 MG PO TABS: 15.0000 mg | ORAL_TABLET | Freq: Every day | ORAL | 0 refills | Status: DC

## 2019-09-29 MED ORDER — HYDROCHLOROTHIAZIDE 25 MG PO TABS: 25.0000 mg | ORAL_TABLET | Freq: Every day | ORAL | 0 refills | Status: DC

## 2019-09-29 NOTE — Progress Notes (Signed)
Patient ID: Alan Mullen, male   DOB: Jun 24, 1970, 49 y.o.   MRN: [id number] Discharge Note:  Patient denies SI/HI AVH at this time. Discharge instructions, AVS, prescriptions and transition record gone over with patient. Patient agrees to comply with medication management, follow-up visit, and outpatient therapy. Patient belongings returned to patient. Patient questions and concerns addressed and answered.  Patient ambulatory off unit.  Patient discharged to home.

## 2019-09-29 NOTE — BHH Suicide Risk Assessment (Addendum)
[facility] Discharge Suicide Risk Assessment   Principal Problem: Substance Induced Mood Disorder, Cocaine Use Disorder Discharge Diagnoses: Active Problems:   Major depressive disorder, recurrent episode, severe (HCC)   Cocaine-induced mood disorder (HCC)   Total Time spent with patient: 30 minutes  Musculoskeletal: Strength & Muscle Tone: within normal limits Gait & Station: normal Patient leans: N/A  Psychiatric Specialty Exam: Review of Systemsno headache, no chest pain, no shortness of breath, no vomiting , no fever or chills   Blood pressure (!) 129/103, pulse 67, temperature 98.2 F (36.8 C), temperature source Oral, SpO2 97 %.There is no height or weight on file to calculate BMI.  General Appearance: improved grooming   Eye Contact::  Fair  Speech:  Normal Rate409  Volume:  Normal  Mood:  improved , describes as 8/10 with 10 being best   Affect:  appropriate, more reactive   Thought Process:  Linear and Descriptions of Associations: Intact  Orientation:  Full (Time, Place, and Person)  Thought Content:  no hallucinations, no delusions, not internally preoccupied. States occasionally hears voice of deceased sister, but reports has not occured x 2-3 days   Suicidal Thoughts:  No denies suicidal or self injurious ideations, denies homicidal or violent ideations. He specifically also denies homicidal or violent ideations towards mother or any family members.  Homicidal Thoughts:  No  Memory:  recent and remote grossly intact   Judgement:  Other:  improving  Insight:  improving   Psychomotor Activity:  Normal- no psychomotor agitation or restlessness   Concentration:  Good  Recall:  Good  Fund of Knowledge:Good  Language: Good  Akathisia:  Negative  Handed:  Right  AIMS (if indicated):     Assets:  Communication Skills Desire for Improvement Resilience  Sleep:  Number of Hours: 6.75  Cognition: WNL  ADL's:  Intact   Mental Status Per Nursing Assessment::   On Admission:   NA  Demographic Factors:  49 year old male, was staying with mother prior to admission, plans to go live with his brother following discharge   Loss Factors: Substance use disorder, unemployment    Historical Factors: History of cocaine and alcohol use disorder, prior psychiatric admission for depression.   Risk Reduction Factors:   Sense of responsibility to family, Living with another person, especially a relative and Positive coping skills or problem solving skills  Continued Clinical Symptoms:  Today patient presents alert, attentive, calm, cooperative on approach, mood described as " a lot better", affect more reactive, not irritable at this time, no thought disorder, no current psychotic symptoms/not internally preoccupied, future oriented . States his brother , whom he is going to stay with is going to help him find a job. Denies medication side effects. No disruptive or agitated behaviors on unit. Polite on approach. Mother had recently reported concern that patient had threatened to get revenge on her for being committed. At this time patient denies any violent ideations towards either mother , other family, or anyone else . He states he has no intentions to hurt his mother. States " I love her, I would never hurt her"  With his express consent I spoke with both his mother and his brother, both of whom corroborate he has improved since admission and seems " calmer, like [place] again". Mother confirms patient presents with improvement and aware of discharge planning.  Brother also confirms that patient can go stay with him for a period of time.  Cognitive Features That Contribute To Risk:  No  gross cognitive deficits noted upon discharge. Is alert , attentive, and oriented x 3   Suicide Risk:  Mild:  Suicidal ideation of limited frequency, intensity, duration, and specificity.  There are no identifiable plans, no associated intent, mild dysphoria and related symptoms, good  self-control (both objective and subjective assessment), few other risk factors, and identifiable protective factors, including available and accessible social support.   Follow-up Information    [facility] Follow up.   Specialty: Addiction Medicine Why: Referral faxed on 08/01 Contact information: [address] [place] [address] [phone]        [facility] Follow up.   Why: A referral to this provider has been made on your behalf.  Please call daily to check on the status of bed availability. Contact information: [address] [place] [address] [phone]               Plan Of Care/Follow-up recommendations:  Activity:  as tolerated Diet:  heart healthy Tests:  NA Other:  See below  Patient is discharging in good spirits, no grounds for ongoing involuntary commitment at this time He reports he plans to go live with his brother, who will help him find employment . Follow up as above. Will also refer to [facility] for ongoing medical management/follow up.   Craige Cotta, MD 09/29/2019, 9:50 AM
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
## 2019-09-29 NOTE — Plan of Care (Signed)
  Problem: Coping: Goal: Coping ability will improve Outcome: Progressing   Problem: Health Behavior/Discharge Planning: Goal: Compliance with therapeutic regimen will improve Outcome: Progressing   Problem: Safety: Goal: Ability to disclose and discuss suicidal ideas will improve Outcome: Progressing   

## 2019-09-29 NOTE — Progress Notes (Signed)
   09/29/19 0617  Vital Signs  Pulse Rate 67  BP (!) 129/103  BP Location Left Arm  BP Method Automatic  Patient Position (if appropriate) Standing   D:  Patient denies SI/HI. Patient admits to hearing some voices. Patient rates anxiety and depression 4/10.  Patient isolates in room.  A::  Patient took scheduled medicine.  Support and encouragement provided Routine safety checks conducted every 15 minutes. Patient  Informed to notify staff with any concerns.  R:   Safety maintained.

## 2019-09-29 NOTE — Progress Notes (Signed)
Patient presents with flat affect but brightens on approach. Isolative to self, visible in milieu. Patient voiced no complaints or concerns. HS medications given and taken as prescribed.Denies SI, HI, AVH.  Endorses depression. Encouragement and support provided. Safety checks maintained. Mediations given as prescribed. Pt receptive and remains safe on unit with q 15 min checks.

## 2019-09-29 NOTE — Progress Notes (Signed)
  [facility] Adult Case Management Discharge Plan :  Will you be returning to the same living situation after discharge:  No. Going to stay with his brother At discharge, do you have transportation home?: No. [facility] Do you have the ability to pay for your medications: No. [facility] referral   Release of information consent forms completed and in the chart;  Patient's signature needed at discharge.  Patient to Follow up at:  Follow-up Information    [facility] Follow up.   Specialty: Addiction Medicine Why: Referral faxed on 08/01 Contact information: [address] [place] [address] [phone]        [facility] Follow up.   Why: A referral to this provider has been made on your behalf.  Please call daily to check on the status of bed availability. Contact information: [address] [place] [address] [phone]        [facility]. Go on 10/02/2019.   Specialty: Behavioral Health Why: You have an appointment for therapy on 10/02/19 at 4:00 pm. You also have an appointment for medication management on 10/28/19 at 10:00 am. These appointments will be held in person.  Please arrive 15 minutes prior to your appointment. Contact information: [address] [place] [address] [phone]       [facility] Go to.   Specialty: Professional Counselor Why: Please go to this provider for addiction counseling services, during their walk in hours; from 8:30 am to 12:00 pm and 1:00 pm to 2:30 pm, Monday through Friday.   Contact information: [address] [place] [address] [phone]               Next level of care provider has access to [facility] Link:yes  Safety Planning and Suicide Prevention discussed: Yes,  yes     Has patient been referred to the Quitline?: Patient refused referral  Patient has been referred for  addiction treatment: Yes Walk in for [facility] addiction groups  [employer], LCSW 09/29/2019, 11:32 AM

## 2019-10-02 ENCOUNTER — Ambulatory Visit (HOSPITAL_COMMUNITY): Payer: Federal, State, Local not specified - Other | Admitting: Clinical

## 2019-10-28 ENCOUNTER — Telehealth (HOSPITAL_COMMUNITY): Payer: No Payment, Other | Admitting: Psychiatry

## 2019-10-28 ENCOUNTER — Other Ambulatory Visit: Payer: Self-pay

## 2019-11-09 ENCOUNTER — Inpatient Hospital Stay (HOSPITAL_COMMUNITY)
Admission: EM | Admit: 2019-11-09 | Discharge: 2019-11-11 | DRG: 563 | Disposition: A | Discharge status: Home Health | Payer: Self-pay | Attending: Orthopedic Surgery | Admitting: Orthopedic Surgery

## 2019-11-09 ENCOUNTER — Encounter (HOSPITAL_COMMUNITY): Payer: Self-pay

## 2019-11-09 ENCOUNTER — Emergency Department (HOSPITAL_COMMUNITY): Payer: Self-pay

## 2019-11-09 ENCOUNTER — Other Ambulatory Visit: Payer: Self-pay

## 2019-11-09 DIAGNOSIS — F321 Major depressive disorder, single episode, moderate: Secondary | ICD-10-CM | POA: Diagnosis present

## 2019-11-09 DIAGNOSIS — S92064A Nondisplaced intraarticular fracture of right calcaneus, initial encounter for closed fracture: Secondary | ICD-10-CM

## 2019-11-09 DIAGNOSIS — S92061A Displaced intraarticular fracture of right calcaneus, initial encounter for closed fracture: Secondary | ICD-10-CM | POA: Diagnosis present

## 2019-11-09 DIAGNOSIS — M25462 Effusion, left knee: Secondary | ICD-10-CM | POA: Diagnosis present

## 2019-11-09 DIAGNOSIS — S82122A Displaced fracture of lateral condyle of left tibia, initial encounter for closed fracture: Secondary | ICD-10-CM

## 2019-11-09 DIAGNOSIS — Z79899 Other long term (current) drug therapy: Secondary | ICD-10-CM

## 2019-11-09 DIAGNOSIS — S82209A Unspecified fracture of shaft of unspecified tibia, initial encounter for closed fracture: Secondary | ICD-10-CM | POA: Diagnosis present

## 2019-11-09 DIAGNOSIS — Y9361 Activity, american tackle football: Secondary | ICD-10-CM

## 2019-11-09 DIAGNOSIS — I10 Essential (primary) hypertension: Secondary | ICD-10-CM | POA: Diagnosis present

## 2019-11-09 DIAGNOSIS — M62838 Other muscle spasm: Secondary | ICD-10-CM | POA: Diagnosis present

## 2019-11-09 DIAGNOSIS — S82142A Displaced bicondylar fracture of left tibia, initial encounter for closed fracture: Principal | ICD-10-CM | POA: Diagnosis present

## 2019-11-09 DIAGNOSIS — W19XXXA Unspecified fall, initial encounter: Secondary | ICD-10-CM | POA: Diagnosis present

## 2019-11-09 DIAGNOSIS — Z20822 Contact with and (suspected) exposure to covid-19: Secondary | ICD-10-CM | POA: Diagnosis present

## 2019-11-09 DIAGNOSIS — F1414 Cocaine abuse with cocaine-induced mood disorder: Secondary | ICD-10-CM | POA: Diagnosis present

## 2019-11-09 DIAGNOSIS — F17210 Nicotine dependence, cigarettes, uncomplicated: Secondary | ICD-10-CM | POA: Diagnosis present

## 2019-11-09 DIAGNOSIS — X501XXA Overexertion from prolonged static or awkward postures, initial encounter: Secondary | ICD-10-CM

## 2019-11-09 DIAGNOSIS — Z7289 Other problems related to lifestyle: Secondary | ICD-10-CM

## 2019-11-09 DIAGNOSIS — M25561 Pain in right knee: Secondary | ICD-10-CM

## 2019-11-09 DIAGNOSIS — F1290 Cannabis use, unspecified, uncomplicated: Secondary | ICD-10-CM | POA: Diagnosis present

## 2019-11-09 MED ORDER — OXYCODONE-ACETAMINOPHEN 5-325 MG PO TABS
1.0000 | ORAL_TABLET | Freq: Once | ORAL | Status: AC
  Administered 2019-11-09: 1 via ORAL
  Filled 2019-11-09: qty 1

## 2019-11-09 NOTE — ED Triage Notes (Signed)
Patient states he was playing football yesterday and took a bit hit. Patient states he fell, and left knee hit a rock. Patient also c/o right ankle pain.
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
## 2019-11-10 ENCOUNTER — Inpatient Hospital Stay (HOSPITAL_COMMUNITY): Payer: Self-pay

## 2019-11-10 ENCOUNTER — Other Ambulatory Visit: Payer: Self-pay

## 2019-11-10 ENCOUNTER — Emergency Department (HOSPITAL_COMMUNITY): Payer: Self-pay

## 2019-11-10 DIAGNOSIS — S82142A Displaced bicondylar fracture of left tibia, initial encounter for closed fracture: Secondary | ICD-10-CM | POA: Diagnosis present

## 2019-11-10 DIAGNOSIS — S82209A Unspecified fracture of shaft of unspecified tibia, initial encounter for closed fracture: Secondary | ICD-10-CM | POA: Diagnosis present

## 2019-11-10 LAB — HIV ANTIBODY (ROUTINE TESTING W REFLEX): HIV Screen 4th Generation wRfx: NONREACTIVE

## 2019-11-10 LAB — CBC WITH DIFFERENTIAL/PLATELET
Abs Immature Granulocytes: 0.05 10*3/uL (ref 0.00–0.07)
Basophils Absolute: 0 10*3/uL (ref 0.0–0.1)
Basophils Relative: 0 %
Eosinophils Absolute: 0.2 10*3/uL (ref 0.0–0.5)
Eosinophils Relative: 2 %
HCT: 40.3 % (ref 39.0–52.0)
Hemoglobin: 13.1 g/dL (ref 13.0–17.0)
Immature Granulocytes: 1 %
Lymphocytes Relative: 25 %
Lymphs Abs: 2.7 10*3/uL (ref 0.7–4.0)
MCH: 27.3 pg (ref 26.0–34.0)
MCHC: 32.5 g/dL (ref 30.0–36.0)
MCV: 84.1 fL (ref 80.0–100.0)
Monocytes Absolute: 0.9 10*3/uL (ref 0.1–1.0)
Monocytes Relative: 8 %
Neutro Abs: 7.1 10*3/uL (ref 1.7–7.7)
Neutrophils Relative %: 64 %
Platelets: 298 10*3/uL (ref 150–400)
RBC: 4.79 MIL/uL (ref 4.22–5.81)
RDW: 14.2 % (ref 11.5–15.5)
WBC: 10.9 10*3/uL — ABNORMAL HIGH (ref 4.0–10.5)
nRBC: 0 % (ref 0.0–0.2)

## 2019-11-10 LAB — BASIC METABOLIC PANEL
Anion gap: 11 (ref 5–15)
BUN: 9 mg/dL (ref 6–20)
CO2: 27 mmol/L (ref 22–32)
Calcium: 9 mg/dL (ref 8.9–10.3)
Chloride: 100 mmol/L (ref 98–111)
Creatinine, Ser: 0.92 mg/dL (ref 0.61–1.24)
GFR calc Af Amer: >60 mL/min (ref >60)
GFR calc non Af Amer: >60 mL/min (ref >60)
Glucose, Bld: 95 mg/dL (ref 70–99)
Potassium: 3.7 mmol/L (ref 3.5–5.1)
Sodium: 138 mmol/L (ref 135–145)

## 2019-11-10 LAB — ABO/RH: ABO/RH(D): B POS

## 2019-11-10 LAB — PROTIME-INR
INR: 1 (ref 0.8–1.2)
Prothrombin Time: 12.4 seconds (ref 11.4–15.2)

## 2019-11-10 LAB — SARS CORONAVIRUS 2 BY RT PCR (HOSPITAL ORDER, PERFORMED IN ~~LOC~~ HOSPITAL LAB): SARS Coronavirus 2: NEGATIVE

## 2019-11-10 LAB — TYPE AND SCREEN
ABO/RH(D): B POS
Antibody Screen: NEGATIVE

## 2019-11-10 MED ORDER — METHOCARBAMOL 1000 MG/10ML IJ SOLN
500.0000 mg | Freq: Four times a day (QID) | INTRAVENOUS | Status: DC | PRN
  Filled 2019-11-10: qty 5

## 2019-11-10 MED ORDER — CELECOXIB 200 MG PO CAPS
200.0000 mg | ORAL_CAPSULE | Freq: Two times a day (BID) | ORAL | Status: DC
  Administered 2019-11-10 – 2019-11-11 (×3): 200 mg via ORAL
  Filled 2019-11-10 (×3): qty 1

## 2019-11-10 MED ORDER — ACETAMINOPHEN 325 MG PO TABS
325.0000 mg | ORAL_TABLET | Freq: Four times a day (QID) | ORAL | Status: DC | PRN
  Administered 2019-11-11 (×2): 650 mg via ORAL
  Filled 2019-11-10 (×2): qty 2

## 2019-11-10 MED ORDER — ASPIRIN 81 MG PO CHEW
81.0000 mg | CHEWABLE_TABLET | Freq: Two times a day (BID) | ORAL | Status: DC
  Administered 2019-11-10 – 2019-11-11 (×2): 81 mg via ORAL
  Filled 2019-11-10 (×3): qty 1

## 2019-11-10 MED ORDER — METHOCARBAMOL 500 MG PO TABS
500.0000 mg | ORAL_TABLET | Freq: Four times a day (QID) | ORAL | Status: DC | PRN
  Administered 2019-11-10 – 2019-11-11 (×4): 500 mg via ORAL
  Filled 2019-11-10 (×4): qty 1

## 2019-11-10 MED ORDER — HEPARIN SODIUM (PORCINE) 5000 UNIT/ML IJ SOLN
5000.0000 [IU] | Freq: Three times a day (TID) | INTRAMUSCULAR | Status: DC
  Administered 2019-11-10: 5000 [IU] via SUBCUTANEOUS
  Filled 2019-11-10: qty 1

## 2019-11-10 MED ORDER — OXYCODONE HCL 5 MG PO TABS
5.0000 mg | ORAL_TABLET | ORAL | Status: DC | PRN
  Administered 2019-11-10 – 2019-11-11 (×6): 10 mg via ORAL
  Filled 2019-11-10 (×6): qty 2

## 2019-11-10 MED ORDER — HYDROCHLOROTHIAZIDE 25 MG PO TABS
25.0000 mg | ORAL_TABLET | Freq: Every day | ORAL | Status: DC
  Administered 2019-11-10 – 2019-11-11 (×2): 25 mg via ORAL
  Filled 2019-11-10 (×2): qty 1

## 2019-11-10 MED ORDER — LACTATED RINGERS IV SOLN: INTRAVENOUS | Status: AC
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
## 2019-11-10 NOTE — Plan of Care (Signed)
  Problem: Education: Goal: Knowledge of General Education information will improve Description Including pain rating scale, medication(s)/side effects and non-pharmacologic comfort measures Outcome: Progressing   Problem: Nutrition: Goal: Adequate nutrition will be maintained Outcome: Progressing   Problem: Pain Managment: Goal: General experience of comfort will improve Outcome: Progressing   

## 2019-11-10 NOTE — Progress Notes (Signed)
Orthopedic Tech Progress Note Patient Details:  [name] 1970-11-24 [id number]  Patient ID: Alan Mullen, male   DOB: 03-15-1970, 49 y.o.   MRN: [id number]   [name] 11/10/2019, 10:39 AM bledsoe brace ordered from [facility] @1039 .

## 2019-11-10 NOTE — Progress Notes (Signed)
Report received from [facility] RN/ED. Pt arrived to rm 1323 transferred to bed and education on use of call bell given to pt

## 2019-11-10 NOTE — Progress Notes (Signed)
Imaging studies reviewed Okay to eat Full consult to follow

## 2019-11-10 NOTE — Plan of Care (Signed)
POC initiated 

## 2019-11-10 NOTE — Progress Notes (Signed)
°  Subjective: Patient is 49 y.o. Male who was admitted following a football injury yesterday.  He sustained a nondisplaced right calcaneal fracture and a minimally displaced comminuted fracture of the posterior lateral tibial plateau.  He notes that he has been NWB to both lower extremities since being admitted.  He notes pain in his left knee and right heel that are moderate and equal to each other in intensity. 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  He does note right knee pain since his fall with a "shifting" feeling in the medial aspect of the knee.    Objective: Vital signs in last 24 hours: Temp:  [98.1 F (36.7 C)-98.6 F (37 C)] 98.1 F (36.7 C) (09/14 1313) Pulse Rate:  [53-61] 53 (09/14 1313) Resp:  [16-18] 16 (09/14 1313) BP: (131-144)/(79-98) 131/79 (09/14 1313) SpO2:  [98 %-100 %] 99 % (09/14 1313) Weight:  [95.3 kg] 95.3 kg (09/14 0710)  Intake/Output from previous day: No intake/output data recorded. Intake/Output this shift: Total I/O In: 600 [P.O.:600] Out: -   Exam:  Large knee effusion of the left knee.  2+DP pulse of the LLE.  DF/PF intact.  ROM from 0 degrees to 75 degrees.  No joint line TTP present.  No ligamentous laxity.  Compartments are soft and nontender throughout bilateral calves.    Right knee with no effusion.  Medial joint line TTP present.  No ligamentous laxity on exam.  No TTP throughout the rest of the knee.  No pain with logroll of RLE or LLE.    RLE exam reveals swelling  around the right heel with diffuse TTP throughout the heel.  2+ DP Pulse of the RLE. DF/PF intact.    No TTP or pain with PROM of the bilateral upper extremities. 2+ radial pulse bilaterally.   Labs: Recent Labs    11/10/19 0711  HGB 13.1   Recent Labs    11/10/19 0711  WBC 10.9*  RBC 4.79  HCT 40.3  PLT 298   Recent Labs    11/10/19 0711  NA 138  K 3.7  CL 100  CO2 27  BUN 9  CREATININE 0.92  GLUCOSE 95  CALCIUM 9.0   Recent Labs     11/10/19 0711  INR 1.0    Assessment/Plan: Patient is 49 y.o. Male who is s/p football injury with sustained left tibial plateau fracture and nondisplaced right calcaneal fracture.  MRI negative for ligamentous injury of the left knee.  Plan for NWB RLE due to calc fx.  WBAT LLE in the bledsoe brace at all times when weightbearing.  He may bend his knee to 90 degrees but only when not putting weight on the leg.  Follow-up with Dr. August Saucer in clinic in ~6 days for recheck with XRs to make sure fractures are not displacing.  ASA 81mg  for DVT prophylaxis.  Ordered right knee radiographs to further evaluate right knee pain.  Plan for discharge tomorrow if he is able to ambulate well with PT.    Athleen Feltner 11/10/2019, 5:28 PM

## 2019-11-11 MED ORDER — HYDROCODONE-ACETAMINOPHEN 5-325 MG PO TABS: 1.0000 | ORAL_TABLET | Freq: Three times a day (TID) | ORAL | 0 refills | Status: DC | PRN

## 2019-11-11 MED ORDER — CELECOXIB 200 MG PO CAPS: 200.0000 mg | ORAL_CAPSULE | Freq: Two times a day (BID) | ORAL | 0 refills | Status: DC

## 2019-11-11 MED ORDER — DOCUSATE SODIUM 100 MG PO CAPS: 100.0000 mg | ORAL_CAPSULE | Freq: Two times a day (BID) | ORAL | Status: DC

## 2019-11-11 MED ORDER — POLYETHYLENE GLYCOL 3350 17 G PO PACK: 17.0000 g | PACK | Freq: Every day | ORAL | Status: DC

## 2019-11-11 MED ORDER — ASPIRIN 81 MG PO CHEW
81.0000 mg | CHEWABLE_TABLET | Freq: Every day | ORAL | 0 refills | Status: DC
Start: 2019-11-11 — End: 2020-07-26

## 2019-11-11 MED ORDER — METHOCARBAMOL 500 MG PO TABS: 500.0000 mg | ORAL_TABLET | Freq: Three times a day (TID) | ORAL | 0 refills | Status: DC | PRN

## 2019-11-11 NOTE — Evaluation (Signed)
Physical Therapy Evaluation Patient Details Name: Alan Mullen MRN: [id number] DOB: Jan 09, 1971 Today's Date: 11/11/2019   History of Present Illness  49 y.o. Male who was admitted following a football injury yesterday.  He sustained a nondisplaced right calcaneal fracture and a minimally displaced comminuted fracture of the posterior lateral tibial plateau.  Clinical Impression  Pt admitted with above diagnosis.  Pt able to stand and transfer from high bed ht with min assist, heavy reliance on UB. Advised nursing to transfer back to bed with lateral scooting d/t low chair ht (even with pillows added), d/t pt precautions of no knee flexion with WBing.  Pt states he is unable to utilize a w/c in his mother's home. He actually would benefit from a lift chair to allow him to come to standing on a fully extended LLE and maintain NWB on RLE. Will continue to follow.    Pt currently with functional limitations due to the deficits listed below (see PT Problem List). Pt will benefit from skilled PT to increase their independence and safety with mobility to allow discharge to the venue listed below.       Follow Up Recommendations Home health PT    Equipment Recommendations  3in1 (PT);Other (comment) (needs a lift chair)    Recommendations for Other Services       Precautions / Restrictions Precautions Precautions: Fall Required Braces or Orthoses: Other Brace Other Brace: bledsoe Restrictions Weight Bearing Restrictions: Yes RLE Weight Bearing: Non weight bearing LLE Weight Bearing: Weight bearing as tolerated Other Position/Activity Restrictions: WBAT LLE in Bledsoe brace; per PA notes pt can flex L knee to 90 degrees in brace, no knee flexion with WBing      Mobility  Bed Mobility Overal bed mobility: Needs Assistance Bed Mobility: Supine to Sit     Supine to sit: Min assist     General bed mobility comments: assist with LLE  Transfers Overall transfer level: Needs  assistance Equipment used: Rolling walker (2 wheeled) Transfers: Sit to/from Stand Sit to Stand: From elevated surface;Min assist         General transfer comment: from very high bed d/t no WBing with LLE flexion, pushed down on RW to come to standing  Ambulation/Gait Ambulation/Gait assistance: Min assist;Min guard Gait Distance (Feet): 3 Feet Assistive device: Rolling walker (2 wheeled)       General Gait Details: cues for NWB on RLE--pt is able to maintain NWB without difficulty in standing, min to min/guard for safety, limited by pain L knee with WBing. pivotal steps to chair only  Careers information officer    Modified Rankin (Stroke Patients Only)       Balance Overall balance assessment: Needs assistance   Sitting balance-Leahy Scale: Good       Standing balance-Leahy Scale: Poor Standing balance comment: reliant on UEs                             Pertinent Vitals/Pain Pain Assessment: 0-10 Pain Score: 4  Pain Location: bile LEs Pain Descriptors / Indicators: Sore Pain Intervention(s): Limited activity within patient's tolerance;Monitored during session;Premedicated before session;Repositioned    Home Living Family/patient expects to be discharged to:: Private residence Living Arrangements: Parent Available Help at Discharge: Family Type of Home: House Home Access: Level entry     Home Layout: One level Home Equipment: Environmental consultant - 2 wheels Additional Comments: per pt cannot  move about his mom's house in w/c d/t space constraints    Prior Function Level of Independence: Independent               Hand Dominance        Extremity/Trunk Assessment   Upper Extremity Assessment Upper Extremity Assessment: Overall WFL for tasks assessed    Lower Extremity Assessment Lower Extremity Assessment: RLE deficits/detail;LLE deficits/detail RLE Deficits / Details: able to flex knee to ~60 degrees, limitations d/t  pain RLE: Unable to fully assess due to immobilization LLE Deficits / Details: grossly [facility]       Communication      Cognition Arousal/Alertness: Awake/alert Behavior During Therapy: WFL for tasks assessed/performed Overall Cognitive Status: Within Functional Limits for tasks assessed                                        General Comments      Exercises     Assessment/Plan    PT Assessment Patient needs continued PT services  PT Problem List Decreased activity tolerance;Decreased mobility;Decreased knowledge of use of DME;Pain;Decreased knowledge of precautions       PT Treatment Interventions DME instruction;Therapeutic activities;Gait training;Functional mobility training;Patient/family education    PT Goals (Current goals can be found in the Care Plan section)  Acute Rehab PT Goals Patient Stated Goal: home, no more football PT Goal Formulation: With patient Time For Goal Achievement: 11/25/19 Potential to Achieve Goals: Good    Frequency 7X/week   Barriers to discharge        Co-evaluation               AM-PAC PT "6 Clicks" Mobility  Outcome Measure Help needed turning from your back to your side while in a flat bed without using bedrails?: A Little Help needed moving from lying on your back to sitting on the side of a flat bed without using bedrails?: A Little Help needed moving to and from a bed to a chair (including a wheelchair)?: A Little Help needed standing up from a chair using your arms (e.g., wheelchair or bedside chair)?: A Lot Help needed to walk in hospital room?: A Lot Help needed climbing 3-5 steps with a railing? : Total 6 Click Score: 14    End of Session Equipment Utilized During Treatment: Gait belt Activity Tolerance: Patient tolerated treatment well;Patient limited by pain Patient left: in chair;with call bell/phone within reach;with chair alarm set Nurse Communication: Mobility status;Other (comment);Weight  bearing status (advised nursing sto scoot back to bed, d/t chair ht) PT Visit Diagnosis: Other abnormalities of gait and mobility (R26.89)    Time: 7616-0737 PT Time Calculation (min) (ACUTE ONLY): 25 min   Charges:   PT Evaluation $PT Eval Low Complexity: 1 Low PT Treatments $Therapeutic Activity: 8-22 mins        Delice Bison, PT  Acute Rehab Dept ([facility]) [phone] Pager [phone]  11/11/2019   [facility] 11/11/2019, 11:12 AM

## 2019-11-11 NOTE — TOC Progression Note (Addendum)
Transition of Care [facility]) - Progression Note    Patient Details  Name: Alan Mullen MRN: [id number] Date of Birth: 1970/11/04  [facility]) CM/SW Contact  Lennart Pall, LCSW Phone Number: 11/11/2019, 2:36 PM  Clinical Narrative:   Met with pt today to review recommendations that have been made by PT including HHPT, lift chair and 3n1 commode.  Pt is now aware that, unfortunately, I have been unable to secure HHPT as he is uninsured and the "charity coverage" agency this week ([facility]) has declined pt for services due to his h/o substance abuse and mental health issues.  I am, also, unable to get a lift chair unless pt can pay privately for this - he states, "I think I can get by without that."  I can get the 3n1 commode and will order today.  Have alerted PA and PT to this information.  Addendum 1522:  pt cleared for dc today.  He reports that he has declined the 3n1 and believes the commode at his mother's home is "higher.. I shouldn't have a problem...".    Expected Discharge Plan: [place] (vs. home with self care) Barriers to Discharge: Continued Medical Work up  Expected Discharge Plan and Services Expected Discharge Plan: [place] (vs. home with self care) In-house Referral: Clinical Social Work     Living arrangements for the past 2 months: [place]                 DME Arranged: 3-N-1 - pt declined DME Agency: [employer] Date DME Agency Contacted: 11/11/19 Time DME Agency Contacted: 25 Representative spoke with at DME Agency: [place] ([place]) Interventions    Readmission Risk Interventions Readmission Risk Prevention Plan 11/10/2019 10/28/2017  Post Dischage Appt - Patient refused  Medication Screening - Complete  Transportation Screening Complete Complete  PCP follow-up - Patient refused  Some recent data might be hidden

## 2019-11-11 NOTE — Progress Notes (Signed)
Physical Therapy Treatment Patient Details Name: Alan Mullen MRN: [id number] DOB: Jul 16, 1970 Today's Date: 11/11/2019    History of Present Illness 49 y.o. Male who was admitted following a football injury yesterday.  He sustained a nondisplaced right calcaneal fracture and a minimally displaced comminuted fracture of the posterior lateral tibial plateau.    PT Comments    Pt very pleasant and cooperative. Did well with prone to stand transfer, maintaining all his precautions.   Follow Up Recommendations  Home health PT     Equipment Recommendations  3in1 (PT);Other (comment) (?w/c for transfer --pt reports limited space for w/c in home)    Recommendations for Other Services       Precautions / Restrictions Precautions Precautions: Fall Required Braces or Orthoses: Other Brace Other Brace: bledsoe Restrictions RLE Weight Bearing: Non weight bearing LLE Weight Bearing: Weight bearing as tolerated Other Position/Activity Restrictions: WBAT LLE in Bledsoe brace; per PA notes pt can flex L knee to 90 degrees in brace, no knee flexion with WBing    Mobility  Bed Mobility Overal bed mobility: Needs Assistance Bed Mobility: Rolling;Sidelying to Sit (prone to stand) Rolling: Independent Sidelying to sit: Independent Supine to sit: Min assist     General bed mobility comments: pt instructed in prone to stand transfer WBing on R knee, L toes with LLE in full extension, using UEs to "walk" up into standing and transiton to RW  Transfers Overall transfer level: Needs assistance Equipment used: Rolling walker (2 wheeled) Transfers: Lateral/Scoot Transfers Sit to Stand: From elevated surface;Min assist        Lateral/Scoot Transfers: Supervision General transfer comment: from recliner to bed   Ambulation/Gait Ambulation/Gait assistance: Min assist;Min guard Gait Distance (Feet): 3 Feet Assistive device: Rolling walker (2 wheeled)       General Gait Details: cues for  NWB on RLE--pt is able to maintain NWB without difficulty in standing, min to min/guard for safety, limited by pain L knee with WBing. pivotal steps to chair only   Optometrist    Modified Rankin (Stroke Patients Only)       Balance Overall balance assessment: Needs assistance   Sitting balance-Leahy Scale: Good       Standing balance-Leahy Scale: Poor Standing balance comment: reliant on UEs                            Cognition Arousal/Alertness: Awake/alert Behavior During Therapy: WFL for tasks assessed/performed Overall Cognitive Status: Within Functional Limits for tasks assessed                                        Exercises      General Comments        Pertinent Vitals/Pain Pain Assessment: 0-10 Pain Score: 4  Pain Location: bil LEs, R heel, L knee Pain Descriptors / Indicators: Sore Pain Intervention(s): Limited activity within patient's tolerance;Monitored during session;Repositioned    Home Living                 Additional Comments: per pt cannot move about his mom's house in w/c d/t space constraints    Prior Function            PT Goals (current goals can now be found in the care plan section) Acute Rehab  PT Goals Patient Stated Goal: home, no more football PT Goal Formulation: With patient Time For Goal Achievement: 11/25/19 Potential to Achieve Goals: Good Progress towards PT goals: Progressing toward goals    Frequency    7X/week      PT Plan Current plan remains appropriate    Co-evaluation              AM-PAC PT "6 Clicks" Mobility   Outcome Measure  Help needed turning from your back to your side while in a flat bed without using bedrails?: None Help needed moving from lying on your back to sitting on the side of a flat bed without using bedrails?: None Help needed moving to and from a bed to a chair (including a wheelchair)?: A Little Help needed  standing up from a chair using your arms (e.g., wheelchair or bedside chair)?: A Lot Help needed to walk in hospital room?: A Little Help needed climbing 3-5 steps with a railing? : Total 6 Click Score: 17    End of Session Equipment Utilized During Treatment: Gait belt;Other (comment) (bledsoe brace on LLE) Activity Tolerance: Patient tolerated treatment well Patient left: in bed;with call bell/phone within reach;with bed alarm set Nurse Communication: Mobility status;Other (comment);Weight bearing status (advised nursing sto scoot back to bed, d/t chair ht) PT Visit Diagnosis: Other abnormalities of gait and mobility (R26.89)     Time: 9937-1696 PT Time Calculation (min) (ACUTE ONLY): 16 min  Charges:  $Therapeutic Activity: 8-22 mins                     Delice Bison, PT  Acute Rehab Dept ([facility]) [phone] Pager [phone]  11/11/2019    [facility] 11/11/2019, 2:50 PM

## 2019-11-18 NOTE — Discharge Summary (Signed)
Physician Discharge Summary      Patient ID: Alan Mullen MRN: [id number] DOB/AGE: [birth year] 51 y.o.  Admit date: 11/09/2019 Discharge date: 11/11/19  Admission Diagnoses:  Active Problems:   Fracture, tibial plateau, left, closed, initial encounter   Tibial fracture   Discharge Diagnoses:  Same  Surgeries:  None   Consultants:   Discharged Condition: Stable  Hospital Course: Alan Henriobin Luck is an 49 y.o. male who was admitted 11/09/2019 with a chief complaint of left knee pain, and found to have a diagnosis of left knee tibial plateau fracture.  He also sustained right calcaneal fracture without displacement. He was evaluated and determined to not need surgical intervention after MRI revealed no ligamentous damage to the left knee.  He was discharged home with NWB precaution to RLE and WBAT precaution to LLE while in full extenison in bledsoe brace.  Pt will f/u with Dr. August Saucerean in clinic in ~1 weeks.   Antibiotics given:  Anti-infectives (From admission, onward)   None    .  Recent vital signs:  Vitals:   11/11/19 0426 11/11/19 1330  BP: (!) 142/88 107/61  Pulse: (!) 55 (!) 53  Resp: 16 16  Temp: 98 F (36.7 C)   SpO2: 97% 100%    Recent laboratory studies:  Results for orders placed or performed during the hospital encounter of 11/09/19  SARS Coronavirus 2 by RT PCR (hospital order, performed in [facility] lab) Nasopharyngeal Nasopharyngeal Swab   Specimen: Nasopharyngeal Swab  Result Value Ref Range   SARS Coronavirus 2 NEGATIVE NEGATIVE  Basic metabolic panel  Result Value Ref Range   Sodium 138 135 - 145 mmol/L   Potassium 3.7 3.5 - 5.1 mmol/L   Chloride 100 98 - 111 mmol/L   CO2 27 22 - 32 mmol/L   Glucose, Bld 95 70 - 99 mg/dL   BUN 9 6 - 20 mg/dL   Creatinine, Ser 1.910.92 0.61 - 1.24 mg/dL   Calcium 9.0 8.9 - 47.810.3 mg/dL   GFR calc non Af Amer >60 >60 mL/min   GFR calc Af Amer >60 >60 mL/min   Anion gap 11 5 - 15  CBC WITH DIFFERENTIAL  Result  Value Ref Range   WBC 10.9 (H) 4.0 - 10.5 K/uL   RBC 4.79 4.22 - 5.81 MIL/uL   Hemoglobin 13.1 13.0 - 17.0 g/dL   HCT 29.540.3 39 - 52 %   MCV 84.1 80.0 - 100.0 fL   MCH 27.3 26.0 - 34.0 pg   MCHC 32.5 30.0 - 36.0 g/dL   RDW 62.114.2 30.811.5 - 65.715.5 %   Platelets 298 150 - 400 K/uL   nRBC 0.0 0.0 - 0.2 %   Neutrophils Relative % 64 %   Neutro Abs 7.1 1.7 - 7.7 K/uL   Lymphocytes Relative 25 %   Lymphs Abs 2.7 0.7 - 4.0 K/uL   Monocytes Relative 8 %   Monocytes Absolute 0.9 0 - 1 K/uL   Eosinophils Relative 2 %   Eosinophils Absolute 0.2 0 - 0 K/uL   Basophils Relative 0 %   Basophils Absolute 0.0 0 - 0 K/uL   Immature Granulocytes 1 %   Abs Immature Granulocytes 0.05 0.00 - 0.07 K/uL  Protime-INR  Result Value Ref Range   Prothrombin Time 12.4 11.4 - 15.2 seconds   INR 1.0 0.8 - 1.2  HIV Antibody (routine testing w rflx)  Result Value Ref Range   HIV Screen 4th Generation wRfx Non Reactive Non Reactive  Type  and screen [facility]  Result Value Ref Range   ABO/RH(D) B POS    Antibody Screen NEG    Sample Expiration      11/13/2019,2359 Performed at [facility], [address], [place], [place] [address]   ABO/Rh  Result Value Ref Range   ABO/RH(D)      B POS Performed at [facility], [address], [place], [place] [address]     Discharge Medications:   Allergies as of 11/11/2019   No Known Allergies     Medication List    TAKE these medications   acetaminophen 500 MG tablet Commonly known as: TYLENOL Take 1,000 mg by mouth every 6 (six) hours as needed for mild pain.   aspirin 81 MG chewable tablet Chew 1 tablet (81 mg total) by mouth daily.   celecoxib 200 MG capsule Commonly known as: CELEBREX Take 1 capsule (200 mg total) by mouth 2 (two) times daily.   hydrochlorothiazide 25 MG tablet Commonly known as: HYDRODIURIL Take 1 tablet (25 mg total) by mouth daily.   HYDROcodone-acetaminophen 5-325 MG  tablet Commonly known as: NORCO/VICODIN Take 1 tablet by mouth every 8 (eight) hours as needed for moderate pain.   methocarbamol 500 MG tablet Commonly known as: ROBAXIN Take 1 tablet (500 mg total) by mouth every 8 (eight) hours as needed for muscle spasms.   mirtazapine 15 MG tablet Commonly known as: REMERON Take 1 tablet (15 mg total) by mouth at bedtime.   multivitamin with minerals Tabs tablet Take 1 tablet by mouth daily.       Diagnostic Studies: DG Knee 1-2 Views Right  Result Date: 11/10/2019 CLINICAL DATA:  Pain EXAM: RIGHT KNEE - 1-2 VIEW COMPARISON:  None. FINDINGS: Frontal and lateral views were obtained. No fracture, dislocation, or joint effusion. Joint spaces appear normal. No erosive change. IMPRESSION: No fracture, dislocation, or joint effusion. No evident arthropathy. Electronically Signed   By: Bretta Bang III M.D.   On: 11/10/2019 20:29   DG Ankle Complete Right  Result Date: 11/09/2019 CLINICAL DATA:  Right ankle injury EXAM: RIGHT ANKLE - COMPLETE 3+ VIEW COMPARISON:  None. FINDINGS: There is no evidence of fracture, dislocation, or joint effusion. There is no evidence of arthropathy or other focal bone abnormality. Subcutaneous edema seen over the calcaneus. IMPRESSION: No acute osseous abnormality. Electronically Signed   By: Jonna Clark M.D.   On: 11/09/2019 15:21   CT Knee Left Wo Contrast  Result Date: 11/10/2019 CLINICAL DATA:  Tibial plateau fracture EXAM: CT OF THE LEFT KNEE WITHOUT CONTRAST TECHNIQUE: Multidetector CT imaging of the LEFT knee was performed according to the standard protocol. Multiplanar CT image reconstructions were also generated. COMPARISON:  None. FINDINGS: Bones/Joint/Cartilage There is a comminuted, minimally displaced fracture of the posterior eminence of the lateral tibial plateau with approximately 1.5 mm depression of the articular surface of the lateral aspect of the fracture which comprises un minority of the articular  surface of the lateral tibial plateau. Fracture fragments are in grossly anatomic alignment. Normal overall alignment. Medial and lateral compartment joint spaces are preserved. Patellofemoral compartment joint space is preserved. Proximal tibiofibular articulation is intact. Ligaments Suboptimally assessed by CT. Ligamentous structures appear grossly intact on this examination. Muscles and Tendons Quadriceps and patellar tendon are intact. Soft tissues Large effusion present. There are numerous surgical clips seen within the posterior deep and medial subcutaneous soft tissues. Moderate atherosclerotic calcification noted within the saphenous-popliteal junction. IMPRESSION: 1. Comminuted,  minimally displaced fracture of the posterior eminence of the lateral tibial plateau with approximately 1.5 mm depression of the articular surface of the lateral tibial plateau. 2. Large knee effusion. Electronically Signed   By: Helyn Numbers MD   On: 11/10/2019 00:09   CT Foot Right Wo Contrast  Result Date: 11/10/2019 CLINICAL DATA:  Right foot pain and swelling after fall. EXAM: CT OF THE RIGHT FOOT WITHOUT CONTRAST TECHNIQUE: Multidetector CT imaging of the right foot was performed according to the standard protocol. Multiplanar CT image reconstructions were also generated. COMPARISON:  None. FINDINGS: Bones/Joint/Cartilage Acute nondisplaced fracture deformity is seen extending through the superior and posterior aspects of the right calcaneus. The remaining osseous structures of the right foot are intact. There is no evidence of dislocation. Ligaments Suboptimally assessed by CT. Muscles and Tendons Muscles and tendons are intact. These are limited in evaluation in the absence of intravenous contrast. Soft tissues Mild soft tissue swelling is seen within the region of the right heel and along the plantar aspect of the right foot. IMPRESSION: Acute nondisplaced fracture of the right calcaneus. Electronically Signed   By:  Aram Candela M.D.   On: 11/10/2019 02:29   MR KNEE LEFT WO CONTRAST  Result Date: 11/10/2019 CLINICAL DATA:  Diffuse left knee pain after being hit while playing football yesterday. Patient fell and left knee hit a rock. EXAM: MRI OF THE LEFT KNEE WITHOUT CONTRAST TECHNIQUE: Multiplanar, multisequence MR imaging of the knee was performed. No intravenous contrast was administered. COMPARISON:  CT left knee and left knee x-rays from yesterday. FINDINGS: MENISCI Medial meniscus:  Intact. Lateral meniscus:  Intact. LIGAMENTS Cruciates:  Intact ACL and PCL. Collaterals: Medial collateral ligament is intact. Lateral collateral ligament complex is intact. CARTILAGE Patellofemoral:  Normal. Medial:  Normal. Lateral: Superficial irregularity over the central lateral tibial plateau. No chondral defect. Joint:  Large lipohemarthrosis.  Minimal edema within Hoffa's fat. Popliteal Fossa:  No Baker cyst. Intact popliteus tendon. Extensor Mechanism: Intact quadriceps tendon and patellar tendon. Intact medial and lateral patellar retinaculum. Intact MPFL. Bones: Acute minimally depressed fracture of the posterior lateral tibial plateau again noted. No additional fracture. No dislocation. No suspicious bone lesion. Other: Mild soft tissue swelling. IMPRESSION: 1. Acute minimally depressed fracture of the posterior lateral tibial plateau. 2. Large lipohemarthrosis. 3. No meniscal or ligamentous injury. Electronically Signed   By: Obie Dredge M.D.   On: 11/10/2019 13:44   DG Knee Complete 4 Views Left  Result Date: 11/09/2019 CLINICAL DATA:  Right ankle injury, while playing football EXAM: LEFT KNEE - COMPLETE 4+ VIEW COMPARISON:  None. FINDINGS: There is question of a nondisplaced lucency seen on the oblique image at the lateral tibial plateau extending to the lateral tibial eminence which could represent a nondisplaced fracture. There is diffuse osteopenia present. A moderate knee joint effusion is seen. Surgical  clips and scattered vascular calcifications are noted. IMPRESSION: Possible lucency at the lateral tibial eminence which could represent a nondisplaced fracture. If further evaluation is required would recommend cross-sectional imaging. Moderate knee joint effusion Electronically Signed   By: Jonna Clark M.D.   On: 11/09/2019 15:23   DG Foot Complete Right  Result Date: 11/09/2019 CLINICAL DATA:  Status post fall. EXAM: RIGHT FOOT COMPLETE - 3+ VIEW COMPARISON:  None. FINDINGS: A very thin linear lucency is seen extending across the right calcaneus. There is no evidence of dislocation. There is no evidence of arthropathy or other focal bone abnormality. Soft tissues are unremarkable. IMPRESSION:  Linear lucency extending across the right calcaneus, which may represent a nondisplaced fracture. CT correlation is recommended if this remains of clinical concern. Electronically Signed   By: Aram Candela M.D.   On: 11/09/2019 23:49    Disposition: Discharge disposition: 01-Home or Self Care       Discharge Instructions    Call MD / Call 911   Complete by: As directed    If you experience chest pain or shortness of breath, CALL 911 and be transported to the hospital emergency room.  If you develope a fever above 101 F, pus (white drainage) or increased drainage or redness at the wound, or calf pain, call your surgeon's office.   Constipation Prevention   Complete by: As directed    Drink plenty of fluids.  Prune juice may be helpful.  You may use a stool softener, such as Colace (over the counter) 100 mg twice a day.  Use MiraLax (over the counter) for constipation as needed.   Diet - low sodium heart healthy   Complete by: As directed    Discharge instructions   Complete by: As directed    Do not walk or weightbear on the right leg.  You may walk on the left leg as long as it is kept straight in the knee brace. You may bend the left knee when you are not putting weight on the leg.  Do not  bend the knee and weightbear at the same time.  Follow-up with Dr. August Saucer in clinic on 11/16/2019.  Call the office at [phone] to schedule appointment.   Increase activity slowly as tolerated   Complete by: As directed        Follow-up Information    [facility] Schedule an appointment as soon as possible for a visit.   Specialty: Professional Counselor Why: Please make an appointment when your physical restrictions are lifted Contact information: [facility] [address] [place] [address] [phone]                Signed: Julieanne Cotton 11/18/2019, 9:16 PM
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
## 2019-12-16 ENCOUNTER — Ambulatory Visit: Payer: Self-pay | Admitting: Orthopedic Surgery

## 2020-02-22 ENCOUNTER — Other Ambulatory Visit: Payer: Self-pay

## 2020-02-22 ENCOUNTER — Emergency Department (HOSPITAL_COMMUNITY)
Admission: EM | Admit: 2020-02-22 | Discharge: 2020-02-23 | Disposition: A | Discharge status: Home / Self Care | Payer: Self-pay | Attending: Emergency Medicine | Admitting: Emergency Medicine

## 2020-02-22 ENCOUNTER — Encounter (HOSPITAL_COMMUNITY): Payer: Self-pay

## 2020-02-22 DIAGNOSIS — Z79899 Other long term (current) drug therapy: Secondary | ICD-10-CM | POA: Insufficient documentation

## 2020-02-22 DIAGNOSIS — Z20822 Contact with and (suspected) exposure to covid-19: Secondary | ICD-10-CM | POA: Insufficient documentation

## 2020-02-22 DIAGNOSIS — F1410 Cocaine abuse, uncomplicated: Secondary | ICD-10-CM | POA: Insufficient documentation

## 2020-02-22 DIAGNOSIS — Z7982 Long term (current) use of aspirin: Secondary | ICD-10-CM | POA: Insufficient documentation

## 2020-02-22 DIAGNOSIS — F17210 Nicotine dependence, cigarettes, uncomplicated: Secondary | ICD-10-CM | POA: Insufficient documentation

## 2020-02-22 DIAGNOSIS — F332 Major depressive disorder, recurrent severe without psychotic features: Secondary | ICD-10-CM | POA: Diagnosis present

## 2020-02-22 DIAGNOSIS — F333 Major depressive disorder, recurrent, severe with psychotic symptoms: Secondary | ICD-10-CM | POA: Insufficient documentation

## 2020-02-22 DIAGNOSIS — R45851 Suicidal ideations: Secondary | ICD-10-CM | POA: Insufficient documentation

## 2020-02-22 DIAGNOSIS — F1414 Cocaine abuse with cocaine-induced mood disorder: Secondary | ICD-10-CM | POA: Diagnosis present

## 2020-02-22 DIAGNOSIS — I10 Essential (primary) hypertension: Secondary | ICD-10-CM | POA: Insufficient documentation

## 2020-02-22 LAB — RAPID URINE DRUG SCREEN, HOSP PERFORMED
Amphetamines: NOT DETECTED
Barbiturates: NOT DETECTED
Benzodiazepines: NOT DETECTED
Cocaine: POSITIVE — AB
Opiates: NOT DETECTED
Tetrahydrocannabinol: POSITIVE — AB

## 2020-02-22 LAB — ETHANOL: Alcohol, Ethyl (B): <10 mg/dL (ref <10)

## 2020-02-22 LAB — CBC
HCT: 40.6 % (ref 39.0–52.0)
Hemoglobin: 13.2 g/dL (ref 13.0–17.0)
MCH: 27.8 pg (ref 26.0–34.0)
MCHC: 32.5 g/dL (ref 30.0–36.0)
MCV: 85.5 fL (ref 80.0–100.0)
Platelets: 278 10*3/uL (ref 150–400)
RBC: 4.75 MIL/uL (ref 4.22–5.81)
RDW: 13.4 % (ref 11.5–15.5)
WBC: 9.3 10*3/uL (ref 4.0–10.5)
nRBC: 0 % (ref 0.0–0.2)

## 2020-02-22 LAB — COMPREHENSIVE METABOLIC PANEL
ALT: 16 U/L (ref 0–44)
AST: 21 U/L (ref 15–41)
Albumin: 3.4 g/dL — ABNORMAL LOW (ref 3.5–5.0)
Alkaline Phosphatase: 59 U/L (ref 38–126)
Anion gap: 8 (ref 5–15)
BUN: 9 mg/dL (ref 6–20)
CO2: 26 mmol/L (ref 22–32)
Calcium: 8.8 mg/dL — ABNORMAL LOW (ref 8.9–10.3)
Chloride: 106 mmol/L (ref 98–111)
Creatinine, Ser: 0.96 mg/dL (ref 0.61–1.24)
GFR, Estimated: >60 mL/min (ref >60)
Glucose, Bld: 95 mg/dL (ref 70–99)
Potassium: 4.1 mmol/L (ref 3.5–5.1)
Sodium: 140 mmol/L (ref 135–145)
Total Bilirubin: 0.5 mg/dL (ref 0.3–1.2)
Total Protein: 6.3 g/dL — ABNORMAL LOW (ref 6.5–8.1)

## 2020-02-22 LAB — RESP PANEL BY RT-PCR (FLU A&B, COVID) ARPGX2
Influenza A by PCR: NEGATIVE
Influenza B by PCR: NEGATIVE
SARS Coronavirus 2 by RT PCR: NEGATIVE

## 2020-02-22 LAB — SALICYLATE LEVEL: Salicylate Lvl: <7 mg/dL — ABNORMAL LOW (ref 7.0–30.0)

## 2020-02-22 LAB — ACETAMINOPHEN LEVEL: Acetaminophen (Tylenol), Serum: <10 ug/mL — ABNORMAL LOW (ref 10–30)

## 2020-02-22 MED ORDER — ALUM & MAG HYDROXIDE-SIMETH 200-200-20 MG/5ML PO SUSP: 30.0000 mL | Freq: Four times a day (QID) | ORAL | Status: DC | PRN

## 2020-02-22 MED ORDER — ZOLPIDEM TARTRATE 5 MG PO TABS
5.0000 mg | ORAL_TABLET | Freq: Every evening | ORAL | Status: DC | PRN
  Administered 2020-02-22: 22:00:00 5 mg via ORAL
  Filled 2020-02-22: qty 1

## 2020-02-22 MED ORDER — ACETAMINOPHEN 325 MG PO TABS: 650.0000 mg | ORAL_TABLET | ORAL | Status: DC | PRN

## 2020-02-22 MED ORDER — ONDANSETRON HCL 4 MG PO TABS: 4.0000 mg | ORAL_TABLET | Freq: Three times a day (TID) | ORAL | Status: DC | PRN

## 2020-02-22 NOTE — BH Assessment (Addendum)
Comprehensive Clinical Assessment (CCA) Note  02/22/2020 Alan Mullen [id number] Patient presents this date voluntary after attempting to ingest a bottle of tylenol prior to arrival. Patient reports this was a attempt to take his life. Patient denies any H/I although reports active AH although is vague in reference to content stating he "just hears things" patient denies any VH. Patient continues to voice S/I at the time of assessment stating he will overdose if discharged. Patient reports a ongoing history of depression although denies having a current OP provider. Per chart review patient was last seen on 09/24/19 when he presented to [facility] with similar symptoms. Patient met inpatient criteria at that time. Patient was discharged on 09/29/19 although states this date he did not follow up with OP services due to transportation issues. Patient has a extensive history of cocaine use stating this date that he uses various amounts of that substance three to four times a week with last use prior to arrival when he reported he used a unknown amount. Patient denies any other SA use with UDS pending this date. Patient currently resides with his mother who could not be reached this date for collateral. Patient reports multiple attempts to take his life as noted above on 09/24/19 and one time earlier this year when patient reported he "was going to overdose." Patient is vague on time frame or if he actually attempted. Patient states he experiences AH in the form of voices, which can occur at times all day, though. Again patient is vague in reference to content. Patient states he does not currently have access to weapons.  Per notes this date [name] MD writes: Patient presents to the ED for evaluation of suicidal ideation.  Patient states he does not feel that his life is worth living anymore. Denies any new stressors.  Told nursing staff that he tried to overdose on Tylenol but his mother prevented him from taking any.  Patient states he did try to overdose on cocaine last night. Patient denies any chest pain or shortness of breath.  He denies any abdominal pain. Patient states he does have a history of mental health problems. He has been admitted to the hospital before for the same.   Patient is oriented x 5. His recent and remote memory is intact. Patient was cooperative throughout the assessment process. Patient's memory was intact and thoughts organized. Patient does not appear to be responding to internal stimuli.  [place]in NP recommended patient be monitored at [facility]. Patient can arrive after 1930 hours.     Chief Complaint:  Chief Complaint  Patient presents with  . Suicidal   Visit Diagnosis: MDD recurrent with psychotic features, severe, Cocaine use     CCA Screening, Triage and Referral (STR)  Patient Reported Information How did you hear about [place]? Self  Referral name: No data recorded Referral phone number: No data recorded  Whom do you see for routine medical problems? I don't have a doctor  Practice/Facility Name: No data recorded Practice/Facility Phone Number: No data recorded Name of Contact: No data recorded Contact Number: No data recorded Contact Fax Number: No data recorded Prescriber Name: No data recorded Prescriber Address (if known): No data recorded  What Is the Reason for Your Visit/Call Today? Ongoing S/I and SA issues  How Long Has This Been Causing You Problems? 1 wk - 1 month  What Do You Feel Would Help You the Most Today? Medication; Therapy   Have You Recently Been in Any Inpatient Treatment (Hospital/Detox/Crisis Center/28-Day  Program)? No  Name/Location of Program/Hospital:No data recorded How Long Were You There? No data recorded When Were You Discharged? No data recorded  Have You Ever Received Services From [facility] Before? Yes  Who Do You See at [facility]? Pt was admitted inpatient in 2019   Have You Recently Had Any Thoughts About  [place]? Yes  Are You Planning to Commit Suicide/Harm Yourself At This time? Yes   Have you Recently Had Thoughts About Hurting Someone [place] Dawn? No  Explanation: No data recorded  Have You Used Any Alcohol or Drugs in the Past 24 Hours? Yes  How Long Ago Did You Use Drugs or Alcohol? 0300  What Did You Use and How Much? Pt states he used a unknown amount of cocaine this date   Do You Currently Have a Therapist/Psychiatrist? No  Name of Therapist/Psychiatrist: No data recorded  Have You Been Recently Discharged From Any Office Practice or Programs? No  Explanation of Discharge From Practice/Program: No data recorded    CCA Screening Triage Referral Assessment Type of Contact: Face-to-Face  Is this Initial or Reassessment? No data recorded Date Telepsych consult ordered in [facility]:  09/24/2019  Time Telepsych consult ordered in [facility]:  1705   Patient Reported Information Reviewed? Yes  Patient Left Without Being Seen? No data recorded Reason for Not Completing Assessment: Pt is asleep at this time. Nurse will call when pt is able to be aroused.   Collateral Involvement: No data recorded  Does Patient Have a Court Appointed Legal Guardian? No data recorded Name and Contact of Legal Guardian: Self  If Minor and Not Living with Parent(s), Who has Custody? N/A  Is CPS involved or ever been involved? Never  Is APS involved or ever been involved? Never   Patient Determined To Be At Risk for Harm To Self or Others Based on Review of Patient Reported Information or Presenting Complaint? Yes, for Self-Harm  Method: No data recorded Availability of Means: No data recorded Intent: No data recorded Notification Required: No data recorded Additional Information for Danger to Others Potential: No data recorded Additional Comments for Danger to Others Potential: No data recorded Are There Guns or Other Weapons in Your Home? No (Reports that a gun was removed from the home  months ago after he voiced SI)  Types of Guns/Weapons: No data recorded Are These Weapons Safely Secured?                            No data recorded Who Could Verify You Are Able To Have These Secured: No data recorded Do You Have any Outstanding Charges, Pending Court Dates, Parole/Probation? No data recorded Contacted To Inform of Risk of Harm To Self or Others: Other: Comment (NA)   Location of Assessment: [facility]   Does Patient Present under Involuntary Commitment? No  IVC Papers Initial File Date: No data recorded  [place] of Residence: [place]   Patient Currently Receiving the Following Services: Not Receiving Services   Determination of Need: -- (To be determined)   Options For Referral: Outpatient Therapy     CCA Biopsychosocial Intake/Chief Complaint:  Ongoing S/I and SA issues  Current Symptoms/Problems: No data recorded  Patient Reported Schizophrenia/Schizoaffective Diagnosis in Past: No   Strengths: No data recorded Preferences: No data recorded Abilities: No data recorded  Type of Services Patient Feels are Needed: No data recorded  Initial Clinical Notes/Concerns: No data recorded  Mental Health Symptoms Depression:  Change in energy/activity   Duration of Depressive symptoms: Greater than two weeks   Mania:  None   Anxiety:   Difficulty concentrating   Psychosis:  None   Duration of Psychotic symptoms: No data recorded  Trauma:  None   Obsessions:  None   Compulsions:  None   Inattention:  None   Hyperactivity/Impulsivity:  N/A   Oppositional/Defiant Behaviors:  None   Emotional Irregularity:  None   Other Mood/Personality Symptoms:  No data recorded   Mental Status Exam Appearance and self-care  Stature:  Average   Weight:  No data recorded  Clothing:  Age-appropriate   Grooming:  Normal   Cosmetic use:  None   Posture/gait:  Normal   Motor activity:  Not Remarkable   Sensorium  Attention:  Distractible    Concentration:  Anxiety interferes   Orientation:  X5   Recall/memory:  Normal   Affect and Mood  Affect:  Appropriate   Mood:  Anxious   Relating  Eye contact:  Fleeting   Facial expression:  Anxious   Attitude toward examiner:  Cooperative   Thought and Language  Speech flow: Normal   Thought content:  Appropriate to Mood and Circumstances   Preoccupation:  None   Hallucinations:  None   Organization:  No data recorded  [employer] of Knowledge:  Fair   Intelligence:  Average   Abstraction:  Normal   Judgement:  Fair   Government social research officer   Insight:  Fair   Decision Making:  Normal   Social Functioning  Social Maturity:  Responsible   Social Judgement:  Normal   Stress  Stressors:  Family conflict   Coping Ability:  Normal   Skill Deficits:  Responsibility   Supports:  Family     Religion: Religion/Spirituality Are You A Religious Person?: No  Leisure/Recreation: Leisure / Recreation Do You Have Hobbies?: No  Exercise/Diet: Exercise/Diet Do You Exercise?: No Have You Gained or Lost A Significant Amount of Weight in the Past Six Months?: No Do You Follow a Special Diet?: No Do You Have Any Trouble Sleeping?: No   CCA Employment/Education Employment/Work Situation: Employment / Work Situation Employment situation: Unemployed  Education:     CCA Family/Childhood History Family and Relationship History: Family history Marital status: Single  Childhood History:  Childhood History Does patient have siblings?: No Did patient suffer any verbal/emotional/physical/sexual abuse as a child?: No Did patient suffer from severe childhood neglect?: No Has patient ever been sexually abused/assaulted/raped as an adolescent or adult?: No Was the patient ever a victim of a crime or a disaster?: No Witnessed domestic violence?: No Has patient been affected by domestic violence as an adult?: No  Child/Adolescent  Assessment:     CCA Substance Use Alcohol/Drug Use: Alcohol / Drug Use Pain Medications: See MAR Prescriptions: See MAR Over the Counter: See MAR History of alcohol / drug use?: Yes Longest period of sobriety (when/how long): Unknown Negative Consequences of Use: Personal relationships Withdrawal Symptoms:  (None) Substance #1 Name of Substance 1: Cocaine 1 - Age of First Use: 18 1 - Amount (size/oz): Varies 1 - Frequency: Varies 1 - Duration: Ongoing 1 - Last Use / Amount: Earlier this date unknown amount                       ASAM's:  Six Dimensions of Multidimensional Assessment  Dimension 1:  Acute Intoxication and/or Withdrawal Potential:   Dimension 1:  Description  of individual's past and current experiences of substance use and withdrawal: Moderate  Dimension 2:  Biomedical Conditions and Complications:   Dimension 2:  Description of patient's biomedical conditions and  complications: Moderate  Dimension 3:  Emotional, Behavioral, or Cognitive Conditions and Complications:  Dimension 3:  Description of emotional, behavioral, or cognitive conditions and complications: Moderate  Dimension 4:  Readiness to Change:  Dimension 4:  Description of Readiness to Change criteria: Moderate  Dimension 5:  Relapse, Continued use, or Continued Problem Potential:  Dimension 5:  Relapse, continued use, or continued problem potential critiera description: severe  Dimension 6:  Recovery/Living Environment:  Dimension 6:  Recovery/Iiving environment criteria description: Moderate  ASAM Severity Score: ASAM's Severity Rating Score: 11  ASAM Recommended Level of Treatment: ASAM Recommended Level of Treatment:  (To be determined)   Substance use Disorder (SUD) Substance Use Disorder (SUD)  Checklist Symptoms of Substance Use: Continued use despite having a persistent/recurrent physical/psychological problem caused/exacerbated by use  Recommendations for  Services/Supports/Treatments: Recommendations for Services/Supports/Treatments Recommendations For Services/Supports/Treatments:  (To be determined)  DSM5 Diagnoses: Patient Active Problem List   Diagnosis Date Noted  . Fracture, tibial plateau, left, closed, initial encounter 11/10/2019  . Tibial fracture 11/10/2019  . Major depressive disorder, recurrent episode, severe ([place]) 09/25/2019  . Cocaine-induced mood disorder ([place])   . Cocaine abuse with cocaine-induced mood disorder ([place]) 03/25/2019  . Major depressive disorder, single episode, moderate degree ([place]) 03/25/2019  . Right inguinal hernia 10/27/2017  . Inguinal hernia of right side with obstruction 10/26/2017    Patient Centered Plan: Patient is on the following Treatment Plan(s):     Referrals to Alternative Service(s): Referred to Alternative Service(s):   Place:   Date:   Time:    Referred to Alternative Service(s):   Place:   Date:   Time:    Referred to Alternative Service(s):   Place:   Date:   Time:    Referred to Alternative Service(s):   Place:   Date:   Time:     Mamie Nick, LCAS
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
## 2020-02-22 NOTE — ED Notes (Signed)
Pt given 2 sandwiches and a ginger ale.

## 2020-02-22 NOTE — BH Assessment (Signed)
[facility] NP recommended patient be monitored at [facility]. Patient can arrive after 1930 hours.

## 2020-02-22 NOTE — ED Notes (Signed)
Pt given snack and soda with PM Remus Loffler

## 2020-02-22 NOTE — ED Notes (Signed)
Pt moved to [place] a, this RN assumed care at this time.

## 2020-02-22 NOTE — ED Triage Notes (Signed)
Pt presents with c/o suicidal ideation. Pt reports that he attempted to take a whole bottle of Tylenol last night and this morning but his mom thwarted both attempts by taking the bottle from him each time. Pt reports life isn't worth living anymore.

## 2020-02-23 ENCOUNTER — Other Ambulatory Visit: Payer: Self-pay

## 2020-02-23 ENCOUNTER — Encounter (HOSPITAL_COMMUNITY): Payer: Self-pay

## 2020-02-23 ENCOUNTER — Encounter (HOSPITAL_COMMUNITY): Payer: Self-pay | Admitting: Registered Nurse

## 2020-02-23 ENCOUNTER — Ambulatory Visit (HOSPITAL_COMMUNITY)
Admission: EM | Admit: 2020-02-23 | Discharge: 2020-02-23 | Disposition: A | Discharge status: Home / Self Care | Payer: No Payment, Other | Attending: Registered Nurse | Admitting: Registered Nurse

## 2020-02-23 DIAGNOSIS — F332 Major depressive disorder, recurrent severe without psychotic features: Secondary | ICD-10-CM

## 2020-02-23 DIAGNOSIS — Z733 Stress, not elsewhere classified: Secondary | ICD-10-CM | POA: Insufficient documentation

## 2020-02-23 DIAGNOSIS — F32A Depression, unspecified: Secondary | ICD-10-CM | POA: Insufficient documentation

## 2020-02-23 DIAGNOSIS — Z653 Problems related to other legal circumstances: Secondary | ICD-10-CM | POA: Insufficient documentation

## 2020-02-23 DIAGNOSIS — F1494 Cocaine use, unspecified with cocaine-induced mood disorder: Secondary | ICD-10-CM | POA: Insufficient documentation

## 2020-02-23 DIAGNOSIS — F1414 Cocaine abuse with cocaine-induced mood disorder: Secondary | ICD-10-CM

## 2020-02-23 DIAGNOSIS — R45851 Suicidal ideations: Secondary | ICD-10-CM | POA: Insufficient documentation

## 2020-02-23 DIAGNOSIS — Z560 Unemployment, unspecified: Secondary | ICD-10-CM | POA: Insufficient documentation

## 2020-02-23 DIAGNOSIS — F1994 Other psychoactive substance use, unspecified with psychoactive substance-induced mood disorder: Secondary | ICD-10-CM | POA: Insufficient documentation

## 2020-02-23 MED ORDER — ADULT MULTIVITAMIN W/MINERALS CH: 1.0000 | ORAL_TABLET | Freq: Every day | ORAL | Status: DC

## 2020-02-23 MED ORDER — ALUM & MAG HYDROXIDE-SIMETH 200-200-20 MG/5ML PO SUSP: 30.0000 mL | ORAL | Status: DC | PRN

## 2020-02-23 MED ORDER — MAGNESIUM HYDROXIDE 400 MG/5ML PO SUSP: 30.0000 mL | Freq: Every day | ORAL | Status: DC | PRN

## 2020-02-23 MED ORDER — ACETAMINOPHEN 325 MG PO TABS: 650.0000 mg | ORAL_TABLET | Freq: Four times a day (QID) | ORAL | Status: DC | PRN

## 2020-02-23 MED ORDER — MIRTAZAPINE 15 MG PO TABS: 15.0000 mg | ORAL_TABLET | Freq: Every day | ORAL | Status: DC

## 2020-02-23 MED ORDER — MIRTAZAPINE 7.5 MG PO TABS: 15.0000 mg | ORAL_TABLET | Freq: Every day | ORAL | Status: DC

## 2020-02-23 MED ORDER — HYDROXYZINE HCL 25 MG PO TABS: 25.0000 mg | ORAL_TABLET | Freq: Three times a day (TID) | ORAL | Status: DC | PRN

## 2020-02-23 MED ORDER — MIRTAZAPINE 15 MG PO TABS: 15.0000 mg | ORAL_TABLET | Freq: Every day | ORAL | 0 refills | Status: DC

## 2020-02-23 NOTE — ED Notes (Signed)
Patient belongings found in [place] [address] cabinet, moved to 1-4 cabinet. Patient also charging ankle monitor at this time.

## 2020-02-23 NOTE — ED Provider Notes (Signed)
Patient accepted to [facility].  Accepting is Dr. Lucianne Muss.  Patient is otherwise stable.   Pricilla Loveless, MD 02/23/20 [phone]

## 2020-02-23 NOTE — ED Notes (Signed)
TTS machine placed at bedside.   

## 2020-02-23 NOTE — ED Notes (Signed)
Safety sitter at bedside 
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
## 2020-02-23 NOTE — ED Provider Notes (Signed)
Behavioral Health Urgent Care Medical Screening Exam  Patient Name: Alan Mullen MRN: [id number] Date of Evaluation: 02/23/20 Chief Complaint:   Diagnosis:  Final diagnoses:  None   HPI: Patient  presented to [facility] for direct admitted to Continuous Assessment Unit. Once patient arrived to [facility] states he is on probation and has to be home by a certain time to night or he will have to go to jail.  Patient denies suicidal/homicidal ideation, psychosis, and paranoia.  Patient will be given resources for outpatient psychiatric services and Rehab services.       Subjective:   Alan Mullen is a 49 y.o. male patient admitted to [facility] ED after presenting with complaints of suicidal ideation and stating that he had attempted to take an overdose of Tylenol but stopped by his mother.   Alan Mullen, 59 y.o., male patient seen via tele psych by this provider, consulted with Dr. Lucianne Muss; and chart reviewed on 02/23/20.  On evaluation Alan Mullen reports he ran out of his psychotropic medications, feeling depressed, and like there was nothing else to live for.  States that drug use to help ease my mind.  States he doesn't use cocaine often and regular use of marijuana to help sleep.  States when he was taking his medication they did help.  Patient was to [facility] after discharge from psychiatric hospitalization in August 2021 but states he never went.  Patient states he is living with his mother and is unable to do a rehab that is greater than 90 days but feels he need rehab and psychiatric services to get better.  Patient states he is able to contract for safety if he is able to get into a rehab facility.  Also discussed restarting his medication and outpatient psychiatric services. Patient states he is currently unemployed and has no insurance.  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   During evaluation Alan Mullen is laying in bed in no acute distress.  He is alert, oriented  x 4, calm and cooperative.  His mood is depress with congruent affect.  He does not appear to be responding to internal/external stimuli or delusional thoughts.  Patient denies suicidal/self-harm/homicidal ideation, psychosis, and paranoia.  Patient answered question appropriately.   Psychiatric Specialty Exam  Presentation  General Appearance:Appropriate for Environment; Casual  Eye Contact:Good  Speech:Clear and Coherent; Normal Rate  Speech Volume:Normal  Handedness:No data recorded  Mood and Affect  Mood:Depressed  Affect:Appropriate; Congruent   Thought Process  Thought Processes:Coherent; Goal Directed  Descriptions of Associations:Intact  Orientation:Full (Time, Place and Person)  Thought Content:No data recorded Hallucinations:None  Ideas of Reference:None  Suicidal Thoughts:Yes, Passive With Plan; With Intent (Was going  to take over dose of Tylenol but stopped by mother)  Homicidal Thoughts:No   Sensorium  Memory:Immediate Good; Recent Good  Judgment:Intact  Insight:Present   Executive Functions  Concentration:Good  Attention Span:Good  Recall:Good  Fund of Knowledge:Good  Language:Good   Psychomotor Activity  Psychomotor Activity:Normal   Assets  Assets:Communication Skills; Desire for Improvement; Housing; Social Support   Sleep  Sleep:Good  Number of hours: No data recorded   Physical Exam Vitals and nursing note reviewed. Exam conducted with a chaperone present.  Constitutional:      Appearance: Normal appearance.  Pulmonary:     Effort: Pulmonary effort is normal.  Musculoskeletal:        General: Normal range of motion.     Cervical back: Normal range of  motion.  Neurological:     Mental Status: He is alert and oriented to person, place, and time.  Psychiatric:        Attention and Perception: Attention and perception normal. He does not perceive auditory or visual hallucinations.        Mood and Affect: Affect  normal. Mood is depressed.        Speech: Speech normal.        Behavior: Behavior normal. Behavior is cooperative.        Thought Content: Thought content normal. Thought content is not paranoid or delusional. Thought content does not include homicidal or suicidal (Reported passive thoughts but able to contract for safety if can get into a rehab facility) ideation.        Cognition and Memory: Cognition and memory normal.        Judgment: Judgment normal.       Review of Systems  Constitutional: Negative.   HENT: Negative.   Eyes: Negative.   Respiratory: Negative.   Cardiovascular: Negative.   Gastrointestinal: Negative.   Genitourinary: Negative.   Musculoskeletal: Negative.   Skin: Negative.   Neurological: Negative.   Hematological: Negative.   Psychiatric/Behavioral: Negative for agitation, behavioral problems, confusion, decreased concentration, dysphoric mood, hallucinations, self-injury and sleep disturbance. Suicidal ideas: Reports he was going to take an overdose of Tylenol but was stopped by his mother. The patient is not hyperactive. Nervous/anxious: Stable.        Reports he ran out of his medications and has not followed up with any outpatient psychiatric services.  Wants to get life together and wanting to go to rehab.  States he checked but was unable to find a rehab facility with available bed.         Blood pressure (!) 145/102, pulse (!) 53, temperature 98.4 F (36.9 C), temperature source Tympanic, resp. rate 16, height 6\' 1"  (1.854 m), weight 95.3 kg, SpO2 100 %. Body mass index is 27.71 kg/m.  Musculoskeletal: Strength & Muscle Tone: within normal limits Gait & Station: normal Patient leans: N/A   [facility] Discharge Disposition for Follow up and Recommendations: Based on my evaluation the patient does not appear to have an emergency medical condition and can be discharged with resources and follow up care in outpatient services for Medication Management,  Partial Hospitalization Program, Substance Abuse Intensive Outpatient Program, Individual Therapy and Group Therapy  Follow-up Information    [facility].   Specialty: Urgent Care Why: Open Access Walk in for medication management and therapy on Monday - Thursday from 8 am to 11 am and on Friday from 1 pm to 4 pm for therapy only Contact information: [phone] [place] [phone]       Call  [facility]   Specialty: Addiction Medicine Why: A referral has been made in the past.  Can call to see availability for intake Contact information: [address] [place] [phone]               [phone], NP 02/23/2020, 1:38 PM

## 2020-02-23 NOTE — Discharge Summary (Signed)
Alan Mullen to be D/C'd Home per NP order.  Discussed with the patient and all questions fully answered.  VSS, Skin clean, dry and intact without evidence of skin break down, no evidence of skin tears noted.   An After Visit Summary was printed and given to the patient. Patient received prescription.  D/c education completed with patient including follow up instructions, medication list, d/c activities limitations if indicated, with other d/c instructions as indicated by NP - patient able to verbalize understanding, all questions fully answered.   Patient instructed to return to ED, call 911, or call NP for any changes in condition.   Patient left [facility] with sister. Leamon Arnt 02/23/2020 1:56 PM

## 2020-02-23 NOTE — ED Triage Notes (Signed)
Received Mariana Kaufman via [facility] with [employer]. He is alert and oriented x4. He stated his chief compliant is suicidal with a plan to take an overdose of tylenol. He stated his mother stopped the event. He denied feeling suicidal at the present time. He was oriented to his new environment and offered nourishments.

## 2020-02-23 NOTE — BH Assessment (Signed)
[facility] Assessment Progress Note  Per Nelly Rout, MD, pt is to be is transferred to the [facility].  Please call report to [phone].  Pt is to be transported via [employer].  EDP Pricilla Loveless, MD and pt's nurse, Susy Frizzle, have been notified.  Doylene Canning, [place] [facility] Coordinator [phone]

## 2020-05-30 ENCOUNTER — Emergency Department (HOSPITAL_COMMUNITY)
Admission: EM | Admit: 2020-05-30 | Discharge: 2020-05-30 | Disposition: A | Discharge status: Home / Self Care | Payer: Self-pay | Attending: Emergency Medicine | Admitting: Emergency Medicine

## 2020-05-30 ENCOUNTER — Encounter (HOSPITAL_COMMUNITY): Payer: Self-pay

## 2020-05-30 ENCOUNTER — Other Ambulatory Visit: Payer: Self-pay

## 2020-05-30 DIAGNOSIS — F17210 Nicotine dependence, cigarettes, uncomplicated: Secondary | ICD-10-CM | POA: Insufficient documentation

## 2020-05-30 DIAGNOSIS — Z79899 Other long term (current) drug therapy: Secondary | ICD-10-CM | POA: Insufficient documentation

## 2020-05-30 DIAGNOSIS — R55 Syncope and collapse: Secondary | ICD-10-CM | POA: Insufficient documentation

## 2020-05-30 DIAGNOSIS — I10 Essential (primary) hypertension: Secondary | ICD-10-CM | POA: Insufficient documentation

## 2020-05-30 DIAGNOSIS — R42 Dizziness and giddiness: Secondary | ICD-10-CM | POA: Insufficient documentation

## 2020-05-30 DIAGNOSIS — Z7982 Long term (current) use of aspirin: Secondary | ICD-10-CM | POA: Insufficient documentation

## 2020-05-30 HISTORY — DX: Anxiety disorder, unspecified: F41.9

## 2020-05-30 LAB — CBC
HCT: 37.9 % — ABNORMAL LOW (ref 39.0–52.0)
Hemoglobin: 12.1 g/dL — ABNORMAL LOW (ref 13.0–17.0)
MCH: 26.9 pg (ref 26.0–34.0)
MCHC: 31.9 g/dL (ref 30.0–36.0)
MCV: 84.4 fL (ref 80.0–100.0)
Platelets: 274 10*3/uL (ref 150–400)
RBC: 4.49 MIL/uL (ref 4.22–5.81)
RDW: 13.9 % (ref 11.5–15.5)
WBC: 9.5 10*3/uL (ref 4.0–10.5)
nRBC: 0 % (ref 0.0–0.2)

## 2020-05-30 LAB — URINALYSIS, ROUTINE W REFLEX MICROSCOPIC
Bacteria, UA: NONE SEEN
Bilirubin Urine: NEGATIVE
Glucose, UA: NEGATIVE mg/dL
Hgb urine dipstick: NEGATIVE
Ketones, ur: NEGATIVE mg/dL
Leukocytes,Ua: NEGATIVE
Nitrite: NEGATIVE
Protein, ur: NEGATIVE mg/dL
Specific Gravity, Urine: 1.009 (ref 1.005–1.030)
pH: 8 (ref 5.0–8.0)

## 2020-05-30 LAB — BASIC METABOLIC PANEL
Anion gap: 8 (ref 5–15)
BUN: 9 mg/dL (ref 6–20)
CO2: 25 mmol/L (ref 22–32)
Calcium: 8.7 mg/dL — ABNORMAL LOW (ref 8.9–10.3)
Chloride: 107 mmol/L (ref 98–111)
Creatinine, Ser: 1.07 mg/dL (ref 0.61–1.24)
GFR, Estimated: >60 mL/min (ref >60)
Glucose, Bld: 135 mg/dL — ABNORMAL HIGH (ref 70–99)
Potassium: 3.8 mmol/L (ref 3.5–5.1)
Sodium: 140 mmol/L (ref 135–145)

## 2020-05-30 LAB — CBG MONITORING, ED: Glucose-Capillary: 144 mg/dL — ABNORMAL HIGH (ref 70–99)

## 2020-05-30 MED ORDER — HYDROCHLOROTHIAZIDE 25 MG PO TABS: 25.0000 mg | ORAL_TABLET | Freq: Every day | ORAL | 1 refills | Status: DC

## 2020-05-30 MED ORDER — SODIUM CHLORIDE 0.9 % IV BOLUS
1000.0000 mL | Freq: Once | INTRAVENOUS | Status: AC
  Administered 2020-05-30: 1000 mL via INTRAVENOUS

## 2020-05-30 NOTE — ED Notes (Signed)
Pt ambulated successfully down the hall.

## 2020-05-30 NOTE — ED Triage Notes (Addendum)
Patient c/o dizziness x 2 weeks. Patient also reports that he has "passed out twice" in the past 2 weeks.  Patient also reports that he has not taken his BP meds in 2-3 months.
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
## 2020-05-30 NOTE — ED Notes (Signed)
Pt given ginger ale and peanut butter and crackers

## 2020-05-30 NOTE — Discharge Instructions (Addendum)
It is important you establish primary care. Stay hydrated and drink plenty of water.  Eat a well-balanced diet.  Avoid drug use. Return emergency department if symptoms worsen.

## 2020-07-20 ENCOUNTER — Encounter (HOSPITAL_COMMUNITY): Payer: Self-pay

## 2020-07-20 ENCOUNTER — Other Ambulatory Visit: Payer: Self-pay

## 2020-07-20 ENCOUNTER — Ambulatory Visit (HOSPITAL_COMMUNITY)
Admission: EM | Admit: 2020-07-20 | Discharge: 2020-07-20 | Disposition: A | Discharge status: Other Acute Inpatient Hospital | Payer: No Payment, Other | Attending: Family | Admitting: Family

## 2020-07-20 ENCOUNTER — Emergency Department (HOSPITAL_COMMUNITY)
Admission: EM | Admit: 2020-07-20 | Discharge: 2020-07-21 | Disposition: A | Discharge status: Home / Self Care | Payer: Self-pay | Attending: Emergency Medicine | Admitting: Emergency Medicine

## 2020-07-20 DIAGNOSIS — Z79899 Other long term (current) drug therapy: Secondary | ICD-10-CM | POA: Insufficient documentation

## 2020-07-20 DIAGNOSIS — F1414 Cocaine abuse with cocaine-induced mood disorder: Secondary | ICD-10-CM

## 2020-07-20 DIAGNOSIS — I10 Essential (primary) hypertension: Secondary | ICD-10-CM | POA: Insufficient documentation

## 2020-07-20 DIAGNOSIS — R45851 Suicidal ideations: Secondary | ICD-10-CM | POA: Insufficient documentation

## 2020-07-20 DIAGNOSIS — F1994 Other psychoactive substance use, unspecified with psychoactive substance-induced mood disorder: Secondary | ICD-10-CM

## 2020-07-20 DIAGNOSIS — F17210 Nicotine dependence, cigarettes, uncomplicated: Secondary | ICD-10-CM | POA: Insufficient documentation

## 2020-07-20 DIAGNOSIS — F1490 Cocaine use, unspecified, uncomplicated: Secondary | ICD-10-CM | POA: Insufficient documentation

## 2020-07-20 DIAGNOSIS — F332 Major depressive disorder, recurrent severe without psychotic features: Secondary | ICD-10-CM | POA: Insufficient documentation

## 2020-07-20 DIAGNOSIS — Z20822 Contact with and (suspected) exposure to covid-19: Secondary | ICD-10-CM | POA: Insufficient documentation

## 2020-07-20 LAB — CBC WITH DIFFERENTIAL/PLATELET
Abs Immature Granulocytes: 0.04 10*3/uL (ref 0.00–0.07)
Basophils Absolute: 0 10*3/uL (ref 0.0–0.1)
Basophils Relative: 0 %
Eosinophils Absolute: 0.2 10*3/uL (ref 0.0–0.5)
Eosinophils Relative: 2 %
HCT: 44.5 % (ref 39.0–52.0)
Hemoglobin: 13.9 g/dL (ref 13.0–17.0)
Immature Granulocytes: 0 %
Lymphocytes Relative: 17 %
Lymphs Abs: 2 10*3/uL (ref 0.7–4.0)
MCH: 26.6 pg (ref 26.0–34.0)
MCHC: 31.2 g/dL (ref 30.0–36.0)
MCV: 85.1 fL (ref 80.0–100.0)
Monocytes Absolute: 0.7 10*3/uL (ref 0.1–1.0)
Monocytes Relative: 6 %
Neutro Abs: 8.6 10*3/uL — ABNORMAL HIGH (ref 1.7–7.7)
Neutrophils Relative %: 75 %
Platelets: 429 10*3/uL — ABNORMAL HIGH (ref 150–400)
RBC: 5.23 MIL/uL (ref 4.22–5.81)
RDW: 13.4 % (ref 11.5–15.5)
WBC: 11.5 10*3/uL — ABNORMAL HIGH (ref 4.0–10.5)
nRBC: 0 % (ref 0.0–0.2)

## 2020-07-20 LAB — COMPREHENSIVE METABOLIC PANEL
ALT: 19 U/L (ref 0–44)
AST: 29 U/L (ref 15–41)
Albumin: 3.7 g/dL (ref 3.5–5.0)
Alkaline Phosphatase: 65 U/L (ref 38–126)
Anion gap: 4 — ABNORMAL LOW (ref 5–15)
BUN: 8 mg/dL (ref 6–20)
CO2: 29 mmol/L (ref 22–32)
Calcium: 9 mg/dL (ref 8.9–10.3)
Chloride: 104 mmol/L (ref 98–111)
Creatinine, Ser: 1.28 mg/dL — ABNORMAL HIGH (ref 0.61–1.24)
GFR, Estimated: >60 mL/min (ref >60)
Glucose, Bld: 104 mg/dL — ABNORMAL HIGH (ref 70–99)
Potassium: 4.5 mmol/L (ref 3.5–5.1)
Sodium: 137 mmol/L (ref 135–145)
Total Bilirubin: 0.7 mg/dL (ref 0.3–1.2)
Total Protein: 7.3 g/dL (ref 6.5–8.1)

## 2020-07-20 LAB — RESP PANEL BY RT-PCR (FLU A&B, COVID) ARPGX2
Influenza A by PCR: NEGATIVE
Influenza B by PCR: NEGATIVE
SARS Coronavirus 2 by RT PCR: NEGATIVE

## 2020-07-20 LAB — ETHANOL: Alcohol, Ethyl (B): <10 mg/dL (ref <10)

## 2020-07-20 LAB — SALICYLATE LEVEL: Salicylate Lvl: <7 mg/dL — ABNORMAL LOW (ref 7.0–30.0)

## 2020-07-20 LAB — ACETAMINOPHEN LEVEL: Acetaminophen (Tylenol), Serum: <10 ug/mL — ABNORMAL LOW (ref 10–30)

## 2020-07-20 MED ORDER — HYDROXYZINE HCL 25 MG PO TABS: 25.0000 mg | ORAL_TABLET | Freq: Three times a day (TID) | ORAL | Status: DC | PRN

## 2020-07-20 MED ORDER — ALUM & MAG HYDROXIDE-SIMETH 200-200-20 MG/5ML PO SUSP: 30.0000 mL | ORAL | Status: DC | PRN

## 2020-07-20 MED ORDER — MAGNESIUM HYDROXIDE 400 MG/5ML PO SUSP: 30.0000 mL | Freq: Every day | ORAL | Status: DC | PRN

## 2020-07-20 MED ORDER — ACETAMINOPHEN 325 MG PO TABS: 650.0000 mg | ORAL_TABLET | Freq: Four times a day (QID) | ORAL | Status: DC | PRN

## 2020-07-20 MED ORDER — TRAZODONE HCL 50 MG PO TABS: 50.0000 mg | ORAL_TABLET | Freq: Every evening | ORAL | Status: DC | PRN

## 2020-07-20 NOTE — ED Notes (Signed)
Sitter has been pulled to sit with another patient. WIll continue to find another sitter.

## 2020-07-20 NOTE — BH Assessment (Addendum)
Triage Note- EMERGENT- Pt's mother IVC'ed for erratic and aggressive behavior toward family and neighbors. Pt reports SI without plan, VH & command AH. He denies HI. Pt appears disheveled & barefoot. Pt is mostly nonverbal- he did acknowledge [facility] was helpful in 2021. Pt stated he wants to go to [facility] and does not want to be here.

## 2020-07-20 NOTE — ED Notes (Signed)
Attempted to call charge nurse to give nurse to nurse report. Unsuccessful. Will try again.

## 2020-07-20 NOTE — ED Triage Notes (Signed)
Patient arrived by [facility] under IVC. Patient arrived from [facility] for evaluation and placement. Patient has hx of BH problems and hasnt had meds x 1 year. States he uses cocaine and marijuana daily and smoker. Last use of drugs yesterday. Patient states that he attempted to takes friends meds 2 days ago to OD and unsure what he took. No physical complaints. Alert and oriented

## 2020-07-20 NOTE — ED Notes (Signed)
3rd attempt to call charge nurse at [facility], unsuccessful. Receive VM. Pt transferred to [facility] via [employer] under IVC. Dr. Wilkie Aye made aware of pt's arrival via Berneice Heinrich, NP.
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
## 2020-07-20 NOTE — ED Notes (Signed)
Faxed IVC paperwork to [facility].  Called [employer] to transport patient.
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
## 2020-07-20 NOTE — ED Notes (Signed)
Unsuccessful 2nd attempt to call charge nurse

## 2020-07-20 NOTE — ED Notes (Signed)
Reminded pt that we still need to collect UA, pt states they cannot provide one at this time

## 2020-07-20 NOTE — ED Provider Notes (Signed)
Emergency Medicine Provider Triage Evaluation Note  Alan Mullen , a 50 y.o. male  was evaluated in triage.  Pt here for psych eval. Pt reports si. States he had plan to take someone else's medication. States he did this a few days ago but he is not sure what he took. He uses marijuana and cocaine. Denies etoh use. He was ivc'ed by family.   Review of Systems  Positive: si Negative: Fevers, cough, chest pain  Physical Exam  There were no vitals taken for this visit. Gen:   Awake, no distress   Resp:  Normal effort MSK:   Moves extremities without difficulty  Other:  Flat affect  Medical Decision Making  Medically screening exam initiated at 4:43 PM.  Appropriate orders placed.  Alan Mullen was informed that the remainder of the evaluation will be completed by another provider, this initial triage assessment does not replace that evaluation, and the importance of remaining in the ED until their evaluation is complete.     [name] 07/20/20 1646    Lorre Nick, MD 07/22/20 [phone]

## 2020-07-20 NOTE — ED Notes (Signed)
Pt sitting on edge of bed eating dinner, no complaints at this time. Calm, cooperative and polite to nursing staff.

## 2020-07-20 NOTE — BH Assessment (Addendum)
Disposition Update:  Berneice Heinrich, NP, accepted patient to [facility] (Adult unit). The attending provider is Dr. Jola Babinski. Bed assignment 406-1. Nurse report [phone]. EDP (Dr. Mesner/Dr. Nicanor Alcon) and patient's nurseShawna Orleans, RN) provided disposition updates. Nursing requested to have patient complete a voluntary admissions form, fax to [facility] [phone], and arrange transport Acupuncturist).  Disposition: @ 1745 the am [facility] (Danika, RN), extended a [facility] bed offer pending negative COVID. @2032  followed up with pm [facility] , RN), regarding patients bed offer at [facility]. Also, provided the update that patient's COVID is negative. Disposition pending [facility] review.
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
## 2020-07-20 NOTE — ED Notes (Signed)
Sitter at bedside.

## 2020-07-21 ENCOUNTER — Encounter (HOSPITAL_COMMUNITY): Payer: Self-pay | Admitting: Family

## 2020-07-21 ENCOUNTER — Inpatient Hospital Stay (HOSPITAL_COMMUNITY)
Admission: AD | Admit: 2020-07-21 | Discharge: 2020-07-26 | DRG: 897 | Disposition: A | Discharge status: Home / Self Care | Payer: Federal, State, Local not specified - Other | Source: Intra-hospital | Attending: Psychiatry | Admitting: Psychiatry

## 2020-07-21 DIAGNOSIS — F14159 Cocaine abuse with cocaine-induced psychotic disorder, unspecified: Secondary | ICD-10-CM | POA: Diagnosis present

## 2020-07-21 DIAGNOSIS — Z818 Family history of other mental and behavioral disorders: Secondary | ICD-10-CM

## 2020-07-21 DIAGNOSIS — R45851 Suicidal ideations: Secondary | ICD-10-CM | POA: Diagnosis present

## 2020-07-21 DIAGNOSIS — F1994 Other psychoactive substance use, unspecified with psychoactive substance-induced mood disorder: Secondary | ICD-10-CM | POA: Diagnosis present

## 2020-07-21 DIAGNOSIS — F1590 Other stimulant use, unspecified, uncomplicated: Secondary | ICD-10-CM | POA: Diagnosis present

## 2020-07-21 DIAGNOSIS — Z79899 Other long term (current) drug therapy: Secondary | ICD-10-CM

## 2020-07-21 DIAGNOSIS — F32A Depression, unspecified: Secondary | ICD-10-CM | POA: Diagnosis present

## 2020-07-21 DIAGNOSIS — Z9151 Personal history of suicidal behavior: Secondary | ICD-10-CM | POA: Diagnosis not present

## 2020-07-21 DIAGNOSIS — R630 Anorexia: Secondary | ICD-10-CM | POA: Diagnosis present

## 2020-07-21 DIAGNOSIS — Z9114 Patient's other noncompliance with medication regimen: Secondary | ICD-10-CM

## 2020-07-21 DIAGNOSIS — Z653 Problems related to other legal circumstances: Secondary | ICD-10-CM | POA: Diagnosis not present

## 2020-07-21 DIAGNOSIS — I10 Essential (primary) hypertension: Secondary | ICD-10-CM | POA: Diagnosis present

## 2020-07-21 DIAGNOSIS — F19959 Other psychoactive substance use, unspecified with psychoactive substance-induced psychotic disorder, unspecified: Secondary | ICD-10-CM | POA: Diagnosis present

## 2020-07-21 DIAGNOSIS — Z6825 Body mass index (BMI) 25.0-25.9, adult: Secondary | ICD-10-CM

## 2020-07-21 DIAGNOSIS — F1290 Cannabis use, unspecified, uncomplicated: Secondary | ICD-10-CM | POA: Diagnosis present

## 2020-07-21 DIAGNOSIS — F1414 Cocaine abuse with cocaine-induced mood disorder: Principal | ICD-10-CM | POA: Diagnosis present

## 2020-07-21 DIAGNOSIS — F329 Major depressive disorder, single episode, unspecified: Secondary | ICD-10-CM | POA: Diagnosis present

## 2020-07-21 HISTORY — DX: Other psychoactive substance use, unspecified with psychoactive substance-induced psychotic disorder, unspecified: F19.959

## 2020-07-21 LAB — TSH: TSH: 0.315 u[IU]/mL — ABNORMAL LOW (ref 0.350–4.500)

## 2020-07-21 LAB — LIPID PANEL
Cholesterol: 164 mg/dL (ref 0–200)
HDL: 56 mg/dL (ref >40)
LDL Cholesterol: 94 mg/dL (ref 0–99)
Total CHOL/HDL Ratio: 2.9 RATIO
Triglycerides: 71 mg/dL (ref <150)
VLDL: 14 mg/dL (ref 0–40)

## 2020-07-21 LAB — RAPID URINE DRUG SCREEN, HOSP PERFORMED
Amphetamines: NOT DETECTED
Barbiturates: NOT DETECTED
Benzodiazepines: NOT DETECTED
Cocaine: POSITIVE — AB
Opiates: NOT DETECTED
Tetrahydrocannabinol: POSITIVE — AB

## 2020-07-21 LAB — T4, FREE: Free T4: 0.8 ng/dL (ref 0.61–1.12)

## 2020-07-21 MED ORDER — ZIPRASIDONE MESYLATE 20 MG IM SOLR: 20.0000 mg | INTRAMUSCULAR | Status: DC | PRN

## 2020-07-21 MED ORDER — HYDROXYZINE HCL 25 MG PO TABS
25.0000 mg | ORAL_TABLET | Freq: Three times a day (TID) | ORAL | Status: DC | PRN
  Administered 2020-07-23: 25 mg via ORAL
  Filled 2020-07-21: qty 1

## 2020-07-21 MED ORDER — MAGNESIUM HYDROXIDE 400 MG/5ML PO SUSP: 30.0000 mL | Freq: Every day | ORAL | Status: DC | PRN

## 2020-07-21 MED ORDER — ALUM & MAG HYDROXIDE-SIMETH 200-200-20 MG/5ML PO SUSP: 30.0000 mL | ORAL | Status: DC | PRN

## 2020-07-21 MED ORDER — LORAZEPAM 1 MG PO TABS: 1.0000 mg | ORAL_TABLET | ORAL | Status: DC | PRN

## 2020-07-21 MED ORDER — MIRTAZAPINE 15 MG PO TABS
15.0000 mg | ORAL_TABLET | Freq: Every day | ORAL | Status: DC
  Administered 2020-07-21 – 2020-07-25 (×5): 15 mg via ORAL
  Filled 2020-07-21 (×7): qty 1

## 2020-07-21 MED ORDER — ACETAMINOPHEN 325 MG PO TABS: 650.0000 mg | ORAL_TABLET | Freq: Four times a day (QID) | ORAL | Status: DC | PRN

## 2020-07-21 MED ORDER — TRAZODONE HCL 50 MG PO TABS: 50.0000 mg | ORAL_TABLET | Freq: Every evening | ORAL | Status: DC | PRN

## 2020-07-21 MED ORDER — OLANZAPINE 5 MG PO TBDP: 5.0000 mg | ORAL_TABLET | Freq: Three times a day (TID) | ORAL | Status: DC | PRN
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
## 2020-07-21 NOTE — ED Notes (Signed)
Pt being transported to [facility] via [employer]. Paper work given to [employer]. Pt belongings were not inventoried and could not be found. Pt made aware. Pt states he only had shorts and t shirt and no valuables. Pt states "I do not care about my clothing." Charge nurse made aware. Prior nurse completed paperwork that is needed to transfer pt. Pt ambulatory with [employer] upon transfer.

## 2020-07-21 NOTE — Progress Notes (Signed)
D: Patient admits to some SI, but no  HI. Pt denies depression but rated anxiety 5/10/. Pt. Was isolative in his room. Pt refused to to outside for group. A:   Support and encouragement provided Routine safety checks conducted every 15 minutes. Patient  Informed to notify staff with any concerns.   R:  Safety maintained.

## 2020-07-21 NOTE — Tx Team (Signed)
Initial Treatment Plan 07/21/2020 2:14 AM [name] HWK:[id number]    PATIENT STRESSORS: Financial difficulties Marital or family conflict Medication change or noncompliance Substance abuse   PATIENT STRENGTHS: Supportive family/friends Work skills   PATIENT IDENTIFIED PROBLEMS: Substance abuse  Depression  "mental problems"  "Drug problems"               DISCHARGE CRITERIA:  Ability to meet basic life and health needs Improved stabilization in mood, thinking, and/or behavior Medical problems require only outpatient monitoring Motivation to continue treatment in a less acute level of care Reduction of life-threatening or endangering symptoms to within safe limits  PRELIMINARY DISCHARGE PLAN: Attend aftercare/continuing care group Attend PHP/IOP Outpatient therapy Return to previous living arrangement Return to previous work or school arrangements  PATIENT/FAMILY INVOLVEMENT: This treatment plan has been presented to and reviewed with the patient, Alan Mullen, and/or family member.  The patient and family have been given the opportunity to ask questions and make suggestions.  Bethann Punches, RN 07/21/2020, 2:14 AM
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
## 2020-07-21 NOTE — Progress Notes (Signed)
Alan Mullen is a 50 y.o. male involuntary admitted for erratic and aggressive behavior towards family and neighbors. Pt has been non compliant with his medication, attempted suicide by overdose 3 days, reported SI with a plan to walk on train track. History of suicide in family, sister committed suicide in 2003. Pt is currently jobless and unble to afford his medications. Report use of marijuana, cocaine and requesting help with drugs. Pt has been calm and cooperative admission process, denied SI/HI, AVH and contracted for safety. Consents signed, skin/belongings search completed and pt oriented to unit. Pt stable at this time. Pt given the opportunity to express concerns and ask questions. Pt given toiletries. Will continue to monitor.

## 2020-07-22 LAB — COMPREHENSIVE METABOLIC PANEL
ALT: 15 U/L (ref 0–44)
AST: 18 U/L (ref 15–41)
Albumin: 3.6 g/dL (ref 3.5–5.0)
Alkaline Phosphatase: 66 U/L (ref 38–126)
Anion gap: 6 (ref 5–15)
BUN: 9 mg/dL (ref 6–20)
CO2: 26 mmol/L (ref 22–32)
Calcium: 9.3 mg/dL (ref 8.9–10.3)
Chloride: 106 mmol/L (ref 98–111)
Creatinine, Ser: 1.06 mg/dL (ref 0.61–1.24)
GFR, Estimated: >60 mL/min (ref >60)
Glucose, Bld: 92 mg/dL (ref 70–99)
Potassium: 4.3 mmol/L (ref 3.5–5.1)
Sodium: 138 mmol/L (ref 135–145)
Total Bilirubin: 0.3 mg/dL (ref 0.3–1.2)
Total Protein: 6.9 g/dL (ref 6.5–8.1)

## 2020-07-22 LAB — T4, FREE: Free T4: 0.74 ng/dL (ref 0.61–1.12)

## 2020-07-22 LAB — HEMOGLOBIN A1C
Hgb A1c MFr Bld: 5.4 % (ref 4.8–5.6)
Mean Plasma Glucose: 108 mg/dL

## 2020-07-22 MED ORDER — HYDROCHLOROTHIAZIDE 12.5 MG PO CAPS
12.5000 mg | ORAL_CAPSULE | Freq: Every day | ORAL | Status: DC
  Administered 2020-07-22 – 2020-07-23 (×2): 12.5 mg via ORAL
  Filled 2020-07-22 (×3): qty 1

## 2020-07-22 MED ORDER — ARIPIPRAZOLE 5 MG PO TABS
5.0000 mg | ORAL_TABLET | Freq: Every day | ORAL | Status: DC
  Administered 2020-07-22 – 2020-07-26 (×5): 5 mg via ORAL
  Filled 2020-07-22 (×6): qty 1
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
## 2020-07-22 NOTE — BHH Group Notes (Signed)
Adult Psychoeducational Group Note  Date:  07/22/2020 Time:  9:15 AM  Group Topic/Focus:  Goals Group:   The focus of this group is to help patients establish daily goals to achieve during treatment and discuss how the patient can incorporate goal setting into their daily lives to aide in recovery.  Participation Level:  Active  Participation Quality:  Drowsy  Affect:  Appropriate  Cognitive:  Alert and Appropriate  Insight: Appropriate and Good  Engagement in Group:  Engaged  Modes of Intervention:  Activity  Additional Comments:  Pt said the goal of today was to make sure he gets some rest. Pt still feels and looks tired and wants to stay in the bed this morning.  Deforest Hoyles Arham Symmonds 07/22/2020, 9:15 AM

## 2020-07-22 NOTE — BHH Counselor (Signed)
CSW provided the Pt with a packet of resources that include: Housing resources, free/reduced food resources, [employer], and suicide prevention resources.  CSW will provide additional resources if requested.

## 2020-07-22 NOTE — Progress Notes (Addendum)
   07/21/20 2115  Psych Admission Type (Psych Patients Only)  Admission Status Involuntary  Psychosocial Assessment  Patient Complaints None  Eye Contact Fair  Facial Expression Anxious  Affect Anxious;Depressed  Speech Logical/coherent  Interaction Minimal;Guarded  Motor Activity Other (Comment) (wnl)  Appearance/Hygiene In scrubs  Behavior Characteristics Cooperative  Mood Depressed;Anxious  Thought Process  Coherency Concrete thinking  Content WDL  Delusions None reported or observed  Perception WDL  Hallucination None reported or observed  Judgment Poor  Confusion None  Danger to Self  Current suicidal ideation? Denies  Danger to Others  Danger to Others None reported or observed   Pt denies SI, HI, AVH and pain. Pt says that he has slept most of the day so he has not heard any voices. Pt isolative and quiet but pleasant. Pt takes meds as prescribed.

## 2020-07-22 NOTE — BHH Group Notes (Signed)
Adult Psychoeducational Group Note  Date:  07/22/2020 Time:  10:36 AM  Group Topic/Focus:  Managing Feelings:   The focus of this group is to identify what feelings patients have difficulty handling and develop a plan to handle them in a healthier way upon discharge.  Participation Level:  Did Not Attend  [name] 07/22/2020, 10:36 AM

## 2020-07-22 NOTE — Progress Notes (Signed)
Recreation Therapy Notes  Date: 5.27.22 Time: 0930 Location: 300 Hall Dayroom  Group Topic: Stress Management  Goal Area(s) Addresses:  Patient will identify positive stress management techniques. Patient will identify benefits of using stress management post d/c.  Intervention: Stress Management  Activity: Meditation.  LRT played a meditation that focused on forgiving self.  Patients were to listen and follow along as the meditation played to fully engage in activity.    Education:  Stress Management, Discharge Planning.   Education Outcome: Acknowledges Education  Clinical Observations/Feedback: Pt did not attend group.    Caroll Rancher, LRT/CTRS         Lillia Abed, Clarissia Mckeen A 07/22/2020 11:06 AM
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
## 2020-07-22 NOTE — Progress Notes (Signed)
Pt observed in bed in bed at intervals during shift "I come in here when I start hearing the voices again. They tell me to do stuff like hurt myself but I know not to do that". Reports fair sleep from last night related to [facility] with good appetite, low energy and good concentration level. Denies active withdrawal symptoms when assessed. Pt presents guarded with flat affect, depressed mood, cautious but forwards on interactions. Rates his depression 5/10 and anxiety 4/10 "I will let you know when I need something. I feel much better when I'm on my medicines and that's why I'm here". Pt encouraged to attend scheduled groups, engaged with others to decrease isolation in his room. Denies SI, HI and VH when assessed "Not right now" and verbally contracts for safety.  Emotional support and encouragement offered to pt throughout this shift. Safety checks maintained on and off unit without outburst. All medications given as ordered with verbal edcucation and effects monitored.  Pt tolerates all meals and medications well without discomfort. Denies concerns at this time. He remains safe on and off unit.

## 2020-07-22 NOTE — [facility] Group Notes (Signed)
Type of Therapy and Topic:  Group Therapy - Healthy vs Unhealthy Coping Skills  Participation Level:  Active   Description of Group The focus of this group was to determine what unhealthy coping techniques typically are used by group members and what healthy coping techniques would be helpful in coping with various problems. Patients were guided in becoming aware of the differences between healthy and unhealthy coping techniques. Patients were asked to identify 2-3 healthy coping skills they would like to learn to use more effectively.  Therapeutic Goals 1. Patients learned that coping is what human beings do all day long to deal with various situations in their lives 2. Patients defined and discussed healthy vs unhealthy coping techniques 3. Patients identified their preferred coping techniques and identified whether these were healthy or unhealthy 4. Patients determined 2-3 healthy coping skills they would like to become more familiar with and use more often. 5. 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 Patient proved open to input from peers and feedback from CSW. Patient demonstrated insight into the subject matter, was respectful of peers, and participated throughout the entire session.  Patient also accepted the worksheets and followed along with those worksheets during the discussion.

## 2020-07-22 NOTE — Progress Notes (Signed)
   07/22/20 0627  Vital Signs  Pulse Rate 82  Resp 20  BP (!) 152/115  BP Location Right Arm  BP Method Automatic  Patient Position (if appropriate) Standing   Pt blood pressure elevated. Pt says he is asymptomatic. Pt has hx of hypertension. Has not taken any meds for hypertension in a long time. Something may need to be ordered for him.

## 2020-07-23 DIAGNOSIS — F1414 Cocaine abuse with cocaine-induced mood disorder: Principal | ICD-10-CM

## 2020-07-23 MED ORDER — AMLODIPINE BESYLATE 5 MG PO TABS
5.0000 mg | ORAL_TABLET | Freq: Every day | ORAL | Status: DC
  Administered 2020-07-24: 5 mg via ORAL
  Filled 2020-07-23 (×3): qty 1

## 2020-07-23 MED ORDER — CLONIDINE HCL 0.1 MG PO TABS: 0.1000 mg | ORAL_TABLET | Freq: Three times a day (TID) | ORAL | Status: DC | PRN

## 2020-07-23 MED ORDER — HYDROCHLOROTHIAZIDE 12.5 MG PO CAPS
12.5000 mg | ORAL_CAPSULE | Freq: Once | ORAL | Status: AC
  Administered 2020-07-23: 12.5 mg via ORAL
  Filled 2020-07-23 (×2): qty 1

## 2020-07-23 MED ORDER — HYDROCHLOROTHIAZIDE 25 MG PO TABS
25.0000 mg | ORAL_TABLET | Freq: Every day | ORAL | Status: DC
  Administered 2020-07-24 – 2020-07-26 (×3): 25 mg via ORAL
  Filled 2020-07-23 (×5): qty 1

## 2020-07-23 NOTE — Progress Notes (Signed)
Pt reports fair appetite and that he slept well last night due to decrease auditory hallucinations "I was able to get some good sleep because I did not hear any voices last night". Rates his anxiety 6/10, depression and hopelessness both 7/10 with stressor being "My family just tired of me using. I can't even talk to my kids now. I'm also worried about my mom because she's by herself now at home too". Pt has been guarded, minimal on interactions and isolative in his room requires multiple prompts to attend scheduled groups. Pt denies SI, HI, AVH and pain at present "I'm ok right now".  All medications given as ordered with verbal education and effects monitored. Emotional support offered to pt throughout this shift. Safety checks continues at Q 15 minutes intervals without self harm gestures or outburst. Pt encouraged to attend to his ADLs and voice concerns. Pt's BP remains elevated and treatment team made aware. New order received for HCTZ 12.5 mg X1 with a total increase dose of 25 mg PO starting tomorrow.   Tolerates meals and medications well without discomfort. Remains safe on and off unit without outburst to note thus far. Pt is verbally redirectable at this time.
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
## 2020-07-23 NOTE — Progress Notes (Signed)
Patient attend wrap up group AA. 

## 2020-07-23 NOTE — Progress Notes (Signed)
   07/23/20 2000  Psych Admission Type (Psych Patients Only)  Admission Status Involuntary  Psychosocial Assessment  Patient Complaints Anxiety;Depression  Eye Contact Brief;Avertive  Facial Expression Anxious  Affect Appropriate to circumstance  Speech Logical/coherent  Interaction Minimal;Guarded;Cautious  Motor Activity Slow  Appearance/Hygiene In scrubs  Behavior Characteristics Cooperative;Anxious  Mood Depressed;Anxious  Thought Process  Coherency Concrete thinking  Content WDL  Delusions None reported or observed  Perception WDL  Hallucination None reported or observed  Judgment Poor  Confusion None  Danger to Self  Current suicidal ideation? Denies  Danger to Others  Danger to Others None reported or observed   Pt denies SI, HI, AVH and pain. Pt seen in bed. Rates anxiety and depression both 4/10. Pt encouraged to go to group this evening. Pt worried about his mother who is older and home alone. Pt asked about length of stay and was told that it is usually 3-7 days but is up to the provider's discretion and the pt's progress in treatment plan. Pt will need assistance with paying for his medication once he is discharged.

## 2020-07-23 NOTE — Progress Notes (Signed)
Patient rated his day as a 10 out of 10 since he is feeling better. He did not share anything else about his day or explain.

## 2020-07-24 DIAGNOSIS — F1414 Cocaine abuse with cocaine-induced mood disorder: Principal | ICD-10-CM

## 2020-07-24 MED ORDER — AMLODIPINE BESYLATE 10 MG PO TABS
10.0000 mg | ORAL_TABLET | Freq: Every day | ORAL | Status: DC
  Administered 2020-07-25 – 2020-07-26 (×2): 10 mg via ORAL
  Filled 2020-07-24 (×3): qty 1

## 2020-07-24 MED ORDER — AMLODIPINE BESYLATE 5 MG PO TABS
5.0000 mg | ORAL_TABLET | Freq: Every day | ORAL | Status: AC
  Administered 2020-07-24: 5 mg via ORAL
  Filled 2020-07-24: qty 1

## 2020-07-24 NOTE — Progress Notes (Signed)
The focus of this group is to help patients review their daily goal of treatment and discuss progress on daily workbooks. Pt attended the evening group session and responded to all discussion prompts from the Writer. Pt shared that today was a generally good day on the unit, but did not give specifics. "I woke up today, didn't I?"  Pt told that his only goal this coming week was to get discharged so that he can paint his car.  Pt rated his day a 10 out of 10 and his affect was appropriate. "My day has to be a 10. I woke up this morning."
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
## 2020-07-24 NOTE — [facility] Group Notes (Signed)
Did not attend group 

## 2020-07-24 NOTE — [facility] Group Notes (Signed)
LCSW Group Therapy Note  07/24/2020       Type of Therapy and Topic:  Group Therapy: "My Mental Health"  Participation Level:  Did Not Attend   Description of Group:   In this group, patients were asked four questions in order to generate discussion around the idea of mental illness and/or substance addiction being medical problems: 1. In one sentence describe the current state of your mental health or substance use. 2. How much do you feel similar to or different from others? 3. Do you tend to identify with other people or compare yourself to them?  4. In a word or sentence, share what you desire your mental health or substance use to be moving forward.  Discussion was held that led to the conclusion that comparing ourselves to others is not healthy, but identifying with the elements of their issues that are similar to ours is helpful.    Therapeutic Goals: 1. Patients will identify their feelings about their current mental health/substance use problems. 2. Patients will describe how they feel similar to or different from others, and whether they tend to identify with or compare themselves to other people with the same issues. 3. Patients will explore the differences in these concepts and how a change of mindset about mental health/substance use can help with reaching recovery goals. 4. Patients will think about and share what their recovery goals are, in terms of mental health and/or substance use.  Summary of Patient Progress:  The patient did not attend.  Therapeutic Modalities:   Processing Motivational Interviewing Psychoeducation  Dillan Lunden J Grossman-Orr, MSW, LCSW  

## 2020-07-24 NOTE — Progress Notes (Signed)
Pt was pleasant and cooperative this shift.  He denied SI/HI/AVH.  Pt stated that his goal is to stop using illicit drugs.  Pt lives with mom and is concerned for her wellbeing since pt is not there to assist mom.  Pt has spent most of the day in his room.  Pt's blood pressure is improving with increased Norvasc.  Pt is anxiious to be discharged ASAP to take care of mom. RN assessed for needs and concerns and provided support.  Pt remains safe on the unit with q 15 min checks in place.

## 2020-07-25 NOTE — [facility] Suicide Risk Assessment (Signed)
[facility] INPATIENT:  Family/Significant Other Suicide Prevention Education  Suicide Prevention Education:  Contact Attempts: Alan Mullen [phone] (Mother), (name of family member/significant other) has been identified by the patient as the family member/significant other with whom the patient will be residing, and identified as the person(s) who will aid the patient in the event of a mental health crisis.  With written consent from the patient, two attempts were made to provide suicide prevention education, prior to and/or following the patient's discharge.  We were unsuccessful in providing suicide prevention education.  A suicide education pamphlet was given to the patient to share with family/significant other.  Date and time of first attempt: 07/25/20   /    1:14pm CSW was unable to leave a voicemail due to voicemail box not being set up.   Date and time of second attempt: CSW will make another attempt at a later time.   [name] 07/25/2020, 1:14 PM

## 2020-07-25 NOTE — Progress Notes (Addendum)
   07/25/20 1059  Psych Admission Type (Psych Patients Only)  Admission Status Involuntary  Psychosocial Assessment  Patient Complaints None  Eye Contact Fair  Facial Expression Anxious  Affect Appropriate to circumstance  Speech Logical/coherent  Interaction Guarded  Motor Activity Slow  Appearance/Hygiene In scrubs  Behavior Characteristics Cooperative;Calm  Mood Pleasant  Aggressive Behavior  Targets Family  Type of Behavior Threatening  Effect No apparent injury  Thought Process  Coherency WDL  Content WDL  Delusions None reported or observed  Perception WDL  Hallucination None reported or observed  Judgment Poor  Confusion None  Danger to Self  Current suicidal ideation? Denies  Danger to Others  Danger to Others None reported or observed  D. Pt presents with an anxious affect/mood- calm, cooperative and friendly during interactions. Per pt's self inventory, pt rated his depression, hopelessness and anxiety a 2/0/2, respectively. Pt states that he takes care of his mother, and that he needs to go home. Pt states that he knows that he needs to stay clean.  Pt currently denies SI/HI and AVH A. Labs and vitals monitored. Pt given and educated on medications. Pt supported emotionally and encouraged to express concerns and ask questions.   R. Pt remains safe with 15 minute checks. Will continue POC.

## 2020-07-25 NOTE — Progress Notes (Signed)
Did not attend. 

## 2020-07-25 NOTE — Progress Notes (Signed)
   07/24/20 0930  Psych Admission Type (Psych Patients Only)  Admission Status Involuntary  Psychosocial Assessment  Patient Complaints Anxiety;Depression  Eye Contact Brief;Avertive  Facial Expression Anxious  Affect Appropriate to circumstance  Speech Logical/coherent  Interaction Minimal;Guarded;Cautious  Motor Activity Slow  Appearance/Hygiene In scrubs  Behavior Characteristics Cooperative;Anxious  Mood Depressed;Anxious  Thought Process  Coherency WDL  Content WDL  Delusions None reported or observed  Perception WDL  Hallucination None reported or observed  Judgment Poor  Confusion None  Danger to Self  Current suicidal ideation? Denies  Danger to Others  Danger to Others None reported or observed
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
## 2020-07-25 NOTE — Progress Notes (Signed)
Recreation Therapy Notes  Date:  5.30.22 Time: 0930 Location: [address] Dayroom  Group Topic: Stress Management  Goal Area(s) Addresses:  Patient will identify positive stress management techniques. Patient will identify benefits of using stress management post d/c.  Intervention: Stress Management  Activity :  Meditation.  LRT played a meditation that focused on being persistent in not only meditation but in life as well.  Patients were to listen and follow along as meditation played to fully engage in activity.  Education:  Stress Management, Discharge Planning.   Education Outcome: Acknowledges Education  Clinical Observations/Feedback: Pt did not attend group session.    Caroll Rancher, LRT/CTRS         Caroll Rancher A 07/25/2020 12:01 PM

## 2020-07-26 DIAGNOSIS — F32A Depression, unspecified: Secondary | ICD-10-CM | POA: Diagnosis present

## 2020-07-26 MED ORDER — HYDROCHLOROTHIAZIDE 25 MG PO TABS: 25.0000 mg | ORAL_TABLET | Freq: Every day | ORAL | 1 refills | Status: AC

## 2020-07-26 MED ORDER — ARIPIPRAZOLE 5 MG PO TABS: 5.0000 mg | ORAL_TABLET | Freq: Every day | ORAL | 0 refills | Status: AC

## 2020-07-26 MED ORDER — AMLODIPINE BESYLATE 10 MG PO TABS: 10.0000 mg | ORAL_TABLET | Freq: Every day | ORAL | 0 refills | Status: AC

## 2020-07-26 MED ORDER — MIRTAZAPINE 15 MG PO TABS: 15.0000 mg | ORAL_TABLET | Freq: Every day | ORAL | 0 refills | Status: AC

## 2020-07-26 NOTE — Progress Notes (Signed)
   07/26/20 0112  Psych Admission Type (Psych Patients Only)  Admission Status Involuntary  Psychosocial Assessment  Patient Complaints None  Eye Contact Fair  Facial Expression Anxious  Affect Appropriate to circumstance  Speech Logical/coherent  Interaction Guarded  Motor Activity Slow  Appearance/Hygiene In scrubs  Behavior Characteristics Cooperative;Calm  Mood Pleasant  Aggressive Behavior  Targets Family  Type of Behavior Threatening  Effect No apparent injury  Thought Process  Coherency WDL  Content WDL  Delusions None reported or observed  Perception WDL  Hallucination None reported or observed  Judgment Poor  Confusion None  Danger to Self  Current suicidal ideation? Denies  Danger to Others  Danger to Others None reported or observed

## 2020-07-26 NOTE — [facility] Suicide Risk Assessment (Signed)
[facility] INPATIENT:  Family/Significant Other Suicide Prevention Education  Suicide Prevention Education:  Contact Attempts: Alan Mullen [phone] (Mother) has been identified by the patient as the family member/significant other with whom the patient will be residing, and identified as the person(s) who will aid the patient in the event of a mental health crisis.  With written consent from the patient, two attempts were made to provide suicide prevention education, prior to and/or following the patient's discharge.  We were unsuccessful in providing suicide prevention education.  A suicide education pamphlet was given to the patient to share with family/significant other.  Date and time of first attempt: 07/25/20 at 1:14pm Date and time of second attempt: 07/26/20 at 9:05am   CSW could not leave a voicemail due to the mailbox being full.    Alan Mullen 07/26/2020, 9:15 AM

## 2020-07-26 NOTE — [facility] Suicide Risk Assessment (Signed)
[facility] Discharge Suicide Risk Assessment   Principal Problem: Cocaine abuse with cocaine-induced mood disorder (HCC) Discharge Diagnoses: Principal Problem:   Cocaine abuse with cocaine-induced mood disorder (HCC) Active Problems:   Substance induced mood disorder (HCC)   Substance-induced psychotic disorder (HCC)   Depression   Total Time spent with patient: 15 minutes  Musculoskeletal: Strength & Muscle Tone: within normal limits Gait & Station: normal Patient leans: N/A  Psychiatric Specialty Exam  Presentation  General Appearance: Casual  Eye Contact:Good  Speech:Normal Rate  Speech Volume:Normal  Handedness:Right   Mood and Affect  Mood:Euthymic  Duration of Depression Symptoms: Greater than two weeks  Affect:Congruent   Thought Process  Thought Processes:Coherent; Goal Directed; Linear  Descriptions of Associations:Intact  Orientation:Full (Time, Place and Person)  Thought Content:Logical  History of Schizophrenia/Schizoaffective disorder:No  Duration of Psychotic Symptoms:Greater than six months  Hallucinations:Hallucinations: None  Ideas of Reference:None  Suicidal Thoughts:Suicidal Thoughts: No  Homicidal Thoughts:Homicidal Thoughts: No   Sensorium  Memory:Immediate Good; Recent Fair; Remote Fair  Judgment:Fair  Insight:Fair   Executive Functions  Concentration:Good  Attention Span:Good  Recall:Fair  Fund of Knowledge:Fair  Language:Good   Psychomotor Activity  Psychomotor Activity:Psychomotor Activity: Normal   Assets  Assets:Communication Skills; Desire for Improvement; Resilience; Social Support; Housing   Sleep  Sleep:Sleep: Good Number of Hours of Sleep: 6.75   Physical Exam: Physical Exam Vitals and nursing note reviewed.  HENT:     Head: Normocephalic.  Pulmonary:     Effort: Pulmonary effort is normal.  Neurological:     General: No focal deficit present.     Mental Status: He is alert and oriented  to person, place, and time.    Review of Systems  Respiratory: Negative.   Cardiovascular: Negative.   Gastrointestinal: Negative.   Psychiatric/Behavioral: Negative for depression, hallucinations and suicidal ideas. The patient is not nervous/anxious and does not have insomnia.    Blood pressure (!) 115/91, pulse 88, temperature 97.8 F (36.6 C), temperature source Oral, resp. rate 16, height 6\' 1"  (1.854 m), weight 87.1 kg, SpO2 100 %. Body mass index is 25.33 kg/m.  Mental Status Per Nursing Assessment::   On Admission:  NA  Demographic Factors:  Male, Low socioeconomic status and Unemployed  Loss Factors: Loss of significant relationship and Financial problems/change in socioeconomic status  Historical Factors: Prior suicide attempts, Family history of suicide, Family history of mental illness or substance abuse and Impulsivity  Risk Reduction Factors:   Sense of responsibility to family, Living with another person, especially a relative and Positive social support, future oriented outlook, positive life satisfaction  Continued Clinical Symptoms:  Depression -improved and currently euthymic Alcohol/Substance Abuse/Dependencies Recent psychosis -now resolved Previous Psychiatric Diagnoses and Treatments  Cognitive Features That Contribute To Risk:  None    Suicide Risk:  Minimal: No identifiable suicidal ideation.  Patients presenting with no risk factors but with morbid ruminations; may be classified as minimal risk based on the severity of the depressive symptoms   Follow-up Information    [facility]. Go on 08/02/2020.   Specialty: Behavioral Health Why: You have an appointment on 08/02/20 at 11:00 am for the substance abuse intensive outpatient program [facility]) and for medication management services.  Contact information: [phone] [place] [phone]              Plan Of Care/Follow-up recommendations:   Activity:  As tolerated  Tests:  You will need to have blood drawn periodically for lab  work to check cholesterol and blood sugar levels while you are taking Abilify.  Your outpatient doctor will let you know when blood needs to be drawn for lab work.  Other:   -Take medications as prescribed.   -Do not drink alcohol.  Do not use marijuana or other drugs.   -Attend intensive outpatient or residential substance abuse treatment program.   -Keep outpatient mental health follow-up appointments with therapist and psychiatrist.   -See your primary care doctor for treatment of hypertension and other medical issues.  Claudie Revering, MD 07/26/2020, 9:34 AM

## 2020-07-26 NOTE — Progress Notes (Signed)
  [facility] Adult Case Management Discharge Plan :  Will you be returning to the same living situation after discharge:  Yes,  With Family  At discharge, do you have transportation home?: Yes,  Safe Transport  Do you have the ability to pay for your medications: Yes,  Family   Release of information consent forms completed and in the chart;  Patient's signature needed at discharge.  Patient to Follow up at:  Follow-up Information    [facility]. Go on 08/02/2020.   Specialty: Behavioral Health Why: You have an appointment on 08/02/20 at 11:00 am for the substance abuse intensive outpatient program [facility]) and for medication management services.  Contact information: [address] [place] [address] [phone]              Next level of care provider has access to [facility] Link:yes  Safety Planning and Suicide Prevention discussed: Yes,  With patient   Have you used any form of tobacco in the last 30 days? (Cigarettes, Smokeless Tobacco, Cigars, and/or Pipes): Patient Refused Screening  Has patient been referred to the Quitline?: Patient refused referral  Patient has been referred for addiction treatment: Yes  Aram Beecham, LCSWA 07/26/2020, 9:29 AM

## 2020-07-26 NOTE — Progress Notes (Signed)
D: Pt A & O X 3. Denies SI, HI, AVH and pain at this time. D/C home as ordered. Picked up in front of facility by [employer]. A: D/C instructions reviewed with pt including prescriptions, medication samples and follow up appointment at [facility] for SAIOP; compliance encouraged. Pt had no belongings in locker at time of departure. Scheduled medications given with verbal education and effects monitored. Safety checks maintained without incident till time of d/c.  R: Pt receptive to care. Compliant with medications when offered. Denies adverse drug reactions when assessed. Verbalized understanding related to d/c instructions. Signed belonging sheet in agreement with no items in locker at time of d/c. Pt ambulatory with a steady gait. Pt appears to be in no physical distress at time of departure.

## 2020-07-26 NOTE — Progress Notes (Signed)
Pt did not attend group. 
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
## 2020-08-02 ENCOUNTER — Ambulatory Visit (HOSPITAL_COMMUNITY): Payer: No Payment, Other | Admitting: Behavioral Health

## 2020-11-26 DEATH — deceased

## 2022-02-08 IMAGING — CT CT FOOT*R* W/O CM
2 series · 13 of 27 positions shown, 16 images · non-contrast
Comparison: None.

CLINICAL DATA: Right foot pain and swelling after fall.

EXAM:
CT OF THE RIGHT FOOT WITHOUT CONTRAST
TECHNIQUE: Multidetector CT imaging of the right foot was performed according
to the standard protocol. Multiplanar CT image reconstructions were
also generated.

[Series 4: axial st · axial · 0.62mm/px · z∈[-230,-54]mm · 8 of 104 slices shown, 10 images]
[im 8/104  soft-tissue]
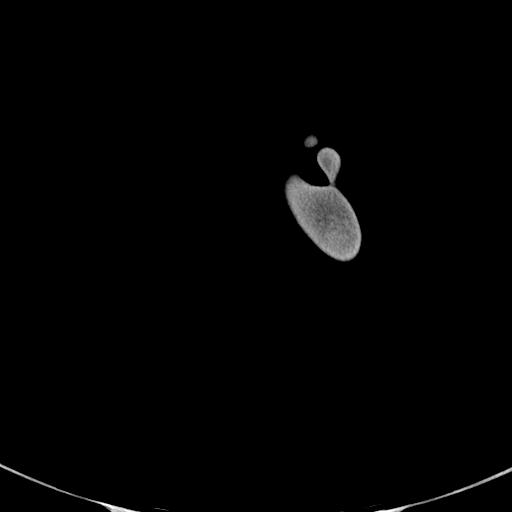
[im 8/104  bone]
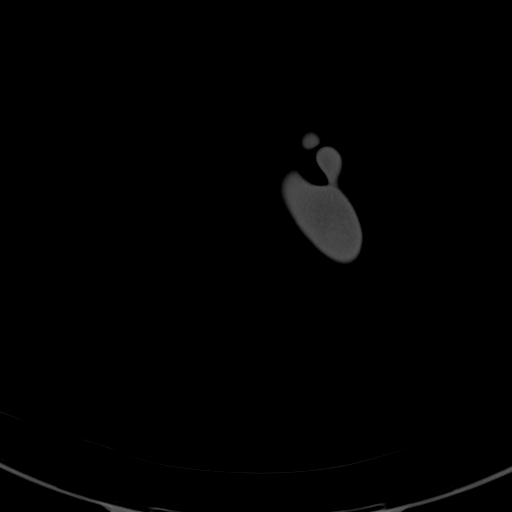
[im 24/104  bone]
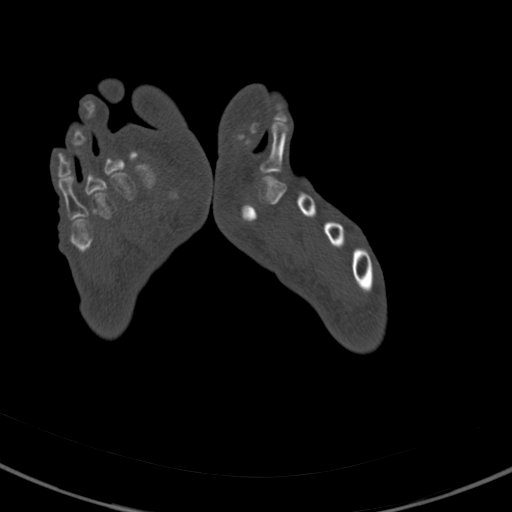
[im 32/104  bone]
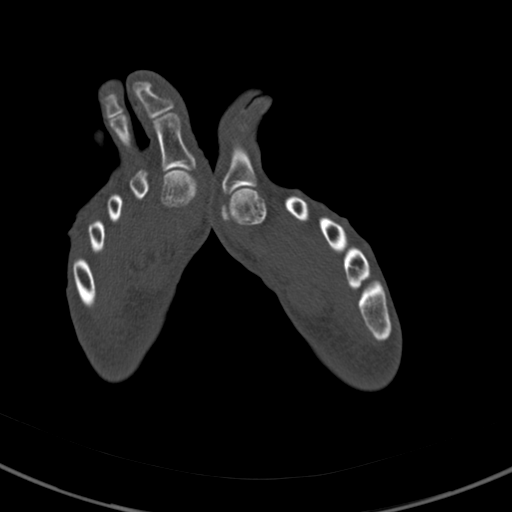
[im 48/104  bone]
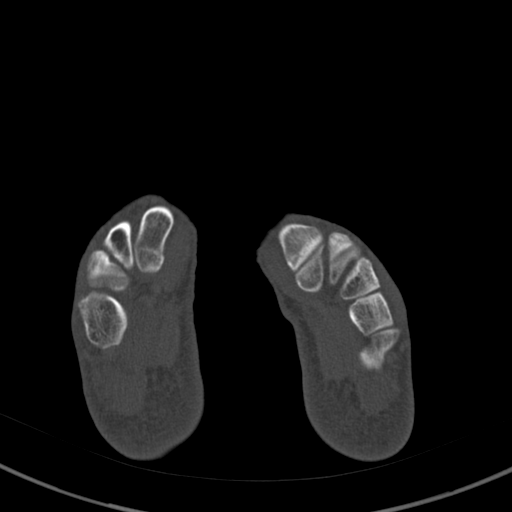
[im 56/104  soft-tissue]
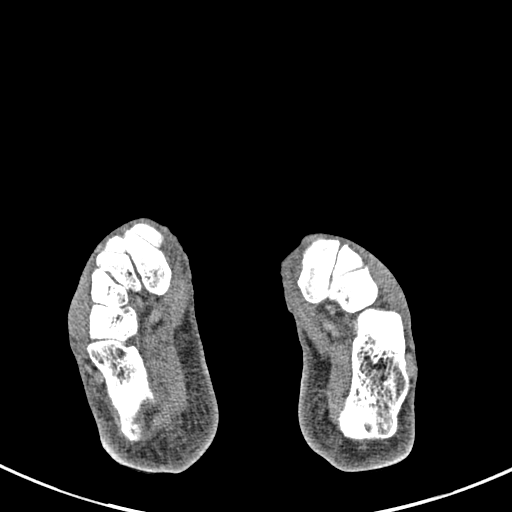
[im 56/104  bone]
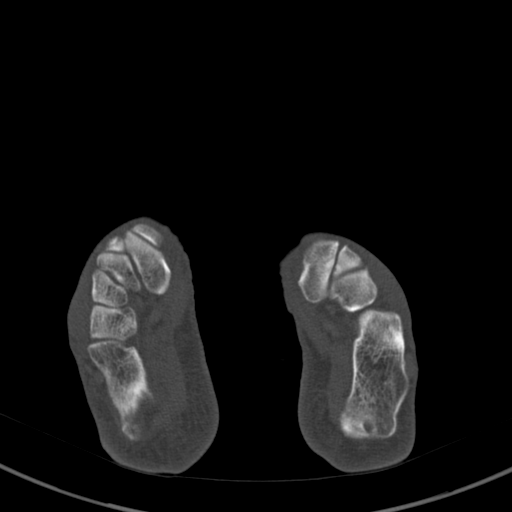
[im 72/104  bone]
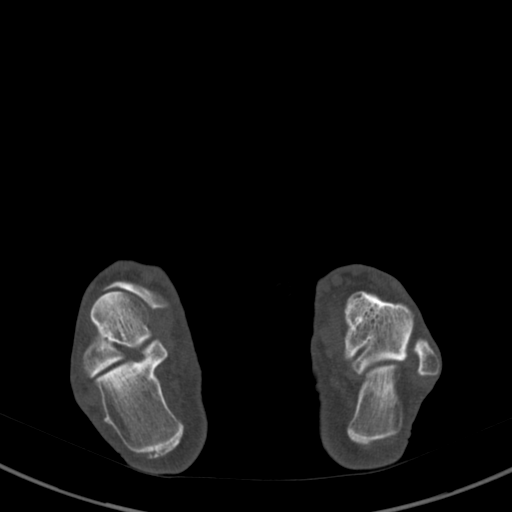
[im 80/104  bone]
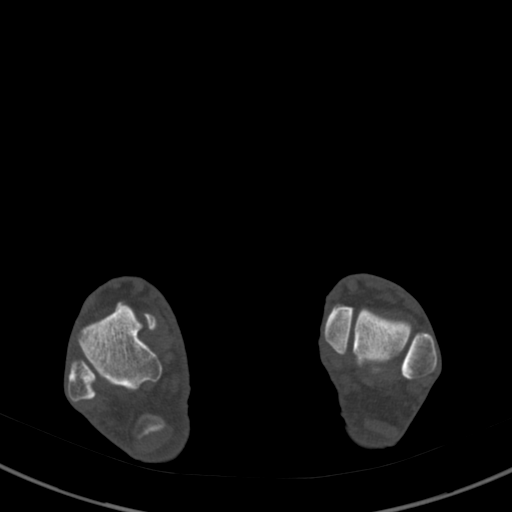
[im 96/104  bone]
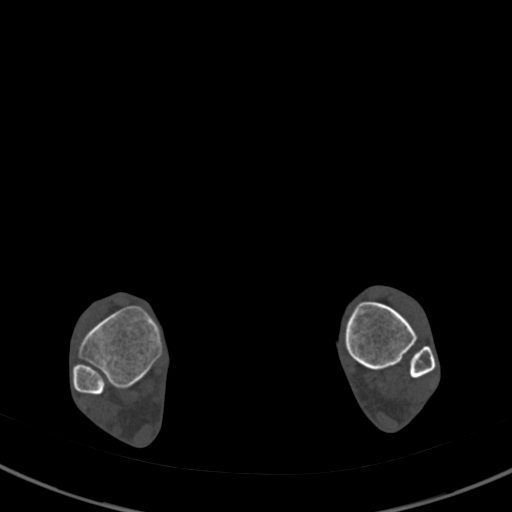

[Series 12: sagittal soft tissue · sagittal · 0.44mm/px · 5 of 60 slices shown, 6 images]
[im 20/60  bone]
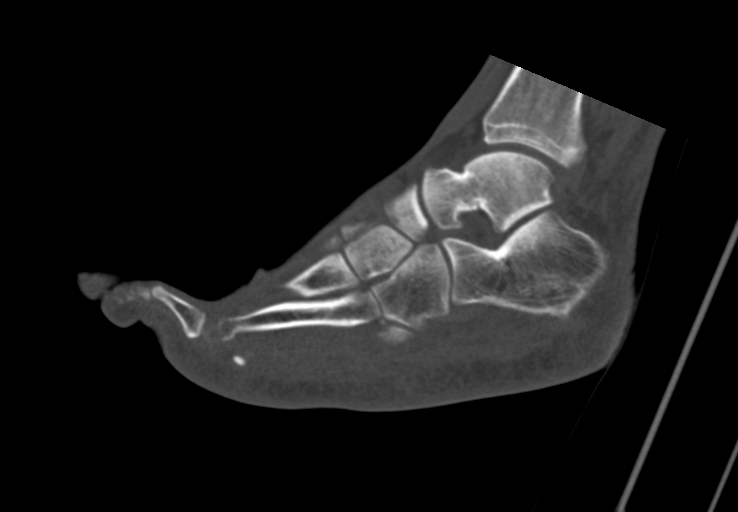
[im 25/60  bone]
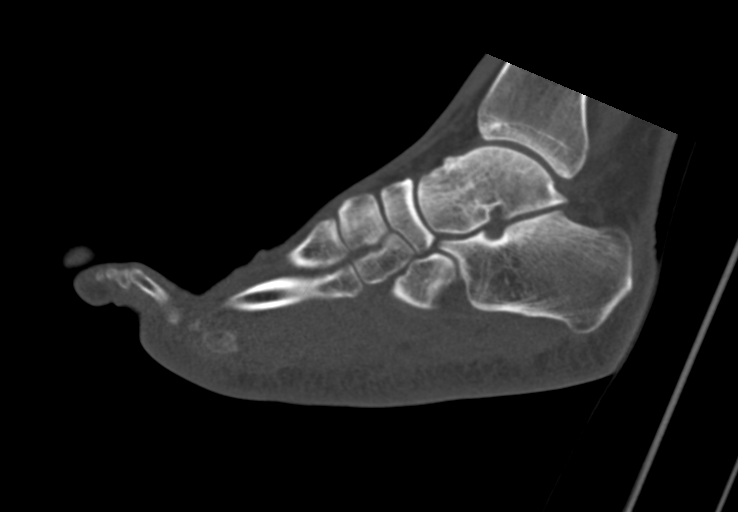
[im 30/60  soft-tissue]
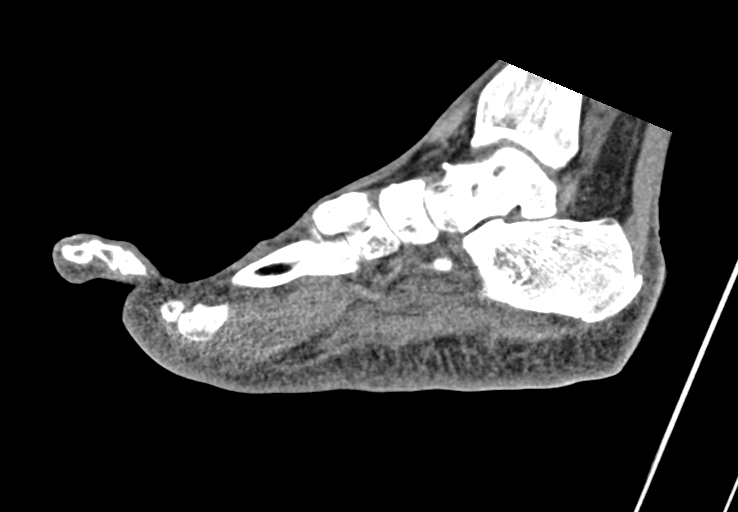
[im 30/60  bone]
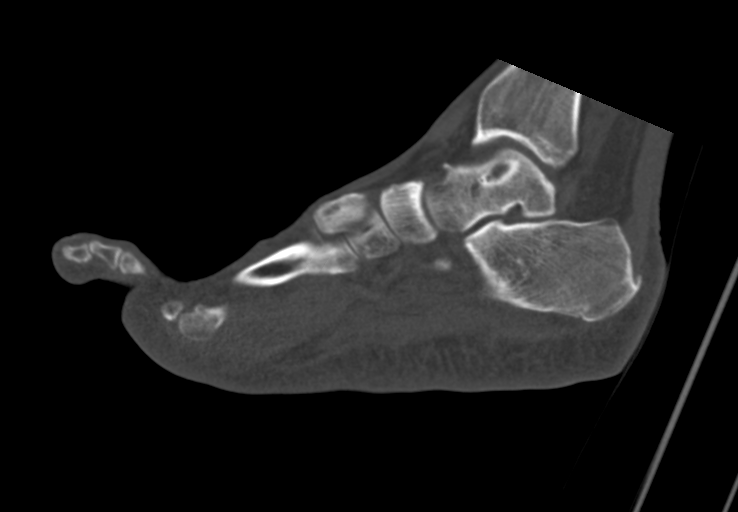
[im 35/60  bone]
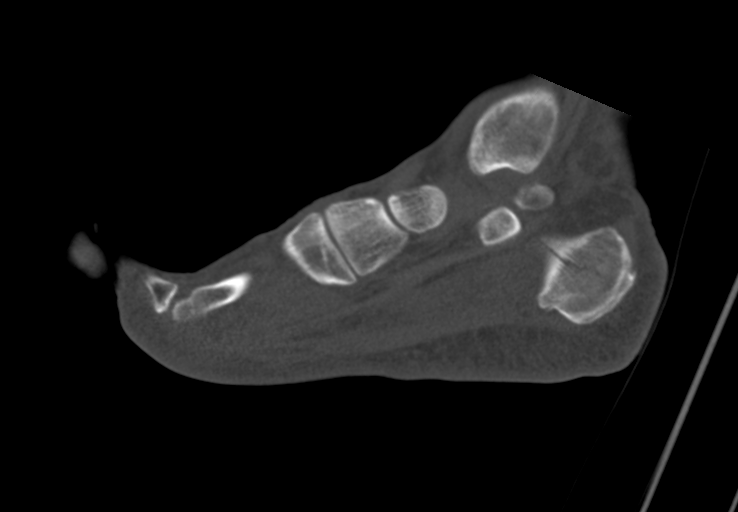
[im 40/60  bone]
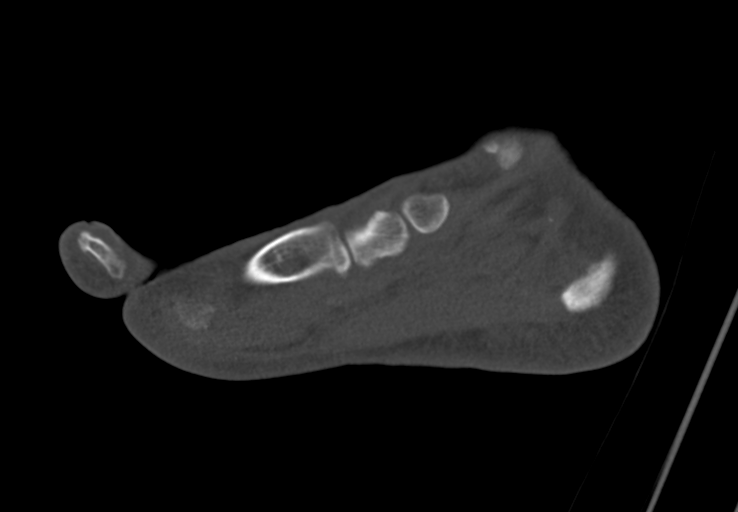

[13 of 27 positions shown; findings below may reference images not displayed]

FINDINGS: Bones/Joint/Cartilage

Acute nondisplaced fracture deformity is seen extending through the
superior and posterior aspects of the right calcaneus.

The remaining osseous structures of the right foot are intact.

There is no evidence of dislocation.

Ligaments

Suboptimally assessed by CT.

Muscles and Tendons

Muscles and tendons are intact. These are limited in evaluation in
the absence of intravenous contrast.

Soft tissues

Mild soft tissue swelling is seen within the region of the right
heel and along the plantar aspect of the right foot.
IMPRESSION: Acute nondisplaced fracture of the right calcaneus.
